# Patient Record
Sex: Male | Born: 1965 | Smoking: Former smoker
Health system: Southern US, Community
[De-identification: ages and names within clinical notes are randomized; demographics above are authoritative.]

## PROBLEM LIST (undated history)

## (undated) DIAGNOSIS — F419 Anxiety disorder, unspecified: Secondary | ICD-10-CM

## (undated) DIAGNOSIS — G500 Trigeminal neuralgia: Secondary | ICD-10-CM

## (undated) DIAGNOSIS — G9050 Complex regional pain syndrome I, unspecified: Secondary | ICD-10-CM

## (undated) DIAGNOSIS — K589 Irritable bowel syndrome without diarrhea: Secondary | ICD-10-CM

## (undated) HISTORY — DX: Anxiety disorder, unspecified: F41.9

## (undated) HISTORY — DX: Complex regional pain syndrome I, unspecified: G90.50

## (undated) HISTORY — DX: Trigeminal neuralgia: G50.0

## (undated) HISTORY — DX: Irritable bowel syndrome without diarrhea: K58.9

## (undated) HISTORY — PX: KNEE SURGERY: SHX244

## (undated) HISTORY — PX: REPLACEMENT TOTAL KNEE: SUR1224

## (undated) HISTORY — PX: INTRATHECAL PUMP IMPLANT: SHX6809

---

## 2019-08-19 ENCOUNTER — Telehealth: Payer: Self-pay

## 2019-08-21 ENCOUNTER — Other Ambulatory Visit: Payer: Self-pay

## 2019-08-21 ENCOUNTER — Ambulatory Visit
Admission: RE | Admit: 2019-08-21 | Discharge: 2019-08-21 | Disposition: A | Discharge status: Home / Self Care | Payer: Medicare Other | Source: Ambulatory Visit | Attending: Pain Medicine | Admitting: Pain Medicine

## 2019-08-21 ENCOUNTER — Ambulatory Visit (HOSPITAL_BASED_OUTPATIENT_CLINIC_OR_DEPARTMENT_OTHER): Payer: Medicare Other | Admitting: Pain Medicine

## 2019-08-21 ENCOUNTER — Encounter: Payer: Self-pay | Admitting: Pain Medicine

## 2019-08-21 VITALS — BP 118/92 | HR 97 | Temp 98.1°F | Resp 16 | Ht 72.0 in | Wt 206.0 lb

## 2019-08-21 DIAGNOSIS — G894 Chronic pain syndrome: Secondary | ICD-10-CM

## 2019-08-21 DIAGNOSIS — Z96651 Presence of right artificial knee joint: Secondary | ICD-10-CM | POA: Insufficient documentation

## 2019-08-21 DIAGNOSIS — G9050 Complex regional pain syndrome I, unspecified: Secondary | ICD-10-CM | POA: Insufficient documentation

## 2019-08-21 DIAGNOSIS — Z95828 Presence of other vascular implants and grafts: Secondary | ICD-10-CM

## 2019-08-21 DIAGNOSIS — M5441 Lumbago with sciatica, right side: Secondary | ICD-10-CM | POA: Insufficient documentation

## 2019-08-21 DIAGNOSIS — M25562 Pain in left knee: Secondary | ICD-10-CM | POA: Insufficient documentation

## 2019-08-21 DIAGNOSIS — M7918 Myalgia, other site: Secondary | ICD-10-CM

## 2019-08-21 DIAGNOSIS — M1712 Unilateral primary osteoarthritis, left knee: Secondary | ICD-10-CM | POA: Insufficient documentation

## 2019-08-21 DIAGNOSIS — M19012 Primary osteoarthritis, left shoulder: Secondary | ICD-10-CM

## 2019-08-21 DIAGNOSIS — Z789 Other specified health status: Secondary | ICD-10-CM | POA: Insufficient documentation

## 2019-08-21 DIAGNOSIS — K589 Irritable bowel syndrome without diarrhea: Secondary | ICD-10-CM

## 2019-08-21 DIAGNOSIS — M792 Neuralgia and neuritis, unspecified: Secondary | ICD-10-CM

## 2019-08-21 DIAGNOSIS — M25561 Pain in right knee: Secondary | ICD-10-CM

## 2019-08-21 DIAGNOSIS — M542 Cervicalgia: Secondary | ICD-10-CM | POA: Insufficient documentation

## 2019-08-21 DIAGNOSIS — M797 Fibromyalgia: Secondary | ICD-10-CM

## 2019-08-21 DIAGNOSIS — M79601 Pain in right arm: Secondary | ICD-10-CM

## 2019-08-21 DIAGNOSIS — M79605 Pain in left leg: Secondary | ICD-10-CM | POA: Diagnosis present

## 2019-08-21 DIAGNOSIS — R252 Cramp and spasm: Secondary | ICD-10-CM

## 2019-08-21 DIAGNOSIS — M51379 Other intervertebral disc degeneration, lumbosacral region without mention of lumbar back pain or lower extremity pain: Secondary | ICD-10-CM

## 2019-08-21 DIAGNOSIS — M25512 Pain in left shoulder: Secondary | ICD-10-CM

## 2019-08-21 DIAGNOSIS — G8929 Other chronic pain: Secondary | ICD-10-CM | POA: Insufficient documentation

## 2019-08-21 DIAGNOSIS — M25532 Pain in left wrist: Secondary | ICD-10-CM

## 2019-08-21 DIAGNOSIS — F411 Generalized anxiety disorder: Secondary | ICD-10-CM | POA: Insufficient documentation

## 2019-08-21 DIAGNOSIS — F329 Major depressive disorder, single episode, unspecified: Secondary | ICD-10-CM

## 2019-08-21 DIAGNOSIS — M5134 Other intervertebral disc degeneration, thoracic region: Secondary | ICD-10-CM

## 2019-08-21 DIAGNOSIS — M79602 Pain in left arm: Secondary | ICD-10-CM

## 2019-08-21 DIAGNOSIS — M25531 Pain in right wrist: Secondary | ICD-10-CM

## 2019-08-21 DIAGNOSIS — M899 Disorder of bone, unspecified: Secondary | ICD-10-CM

## 2019-08-21 DIAGNOSIS — H548 Legal blindness, as defined in USA: Secondary | ICD-10-CM

## 2019-08-21 DIAGNOSIS — Z451 Encounter for adjustment and management of infusion pump: Secondary | ICD-10-CM

## 2019-08-21 DIAGNOSIS — M25511 Pain in right shoulder: Secondary | ICD-10-CM | POA: Diagnosis not present

## 2019-08-21 DIAGNOSIS — M47816 Spondylosis without myelopathy or radiculopathy, lumbar region: Secondary | ICD-10-CM

## 2019-08-21 DIAGNOSIS — M5442 Lumbago with sciatica, left side: Secondary | ICD-10-CM | POA: Diagnosis not present

## 2019-08-21 DIAGNOSIS — M79643 Pain in unspecified hand: Secondary | ICD-10-CM

## 2019-08-21 DIAGNOSIS — R208 Other disturbances of skin sensation: Secondary | ICD-10-CM | POA: Insufficient documentation

## 2019-08-21 DIAGNOSIS — M79604 Pain in right leg: Secondary | ICD-10-CM | POA: Diagnosis present

## 2019-08-21 DIAGNOSIS — M5137 Other intervertebral disc degeneration, lumbosacral region: Secondary | ICD-10-CM

## 2019-08-21 DIAGNOSIS — M5030 Other cervical disc degeneration, unspecified cervical region: Secondary | ICD-10-CM

## 2019-08-21 DIAGNOSIS — Z79899 Other long term (current) drug therapy: Secondary | ICD-10-CM

## 2019-08-21 DIAGNOSIS — F32A Depression, unspecified: Secondary | ICD-10-CM

## 2019-08-21 DIAGNOSIS — G4701 Insomnia due to medical condition: Secondary | ICD-10-CM

## 2019-08-21 DIAGNOSIS — G500 Trigeminal neuralgia: Secondary | ICD-10-CM | POA: Insufficient documentation

## 2019-08-21 DIAGNOSIS — M549 Dorsalgia, unspecified: Secondary | ICD-10-CM

## 2019-08-21 DIAGNOSIS — M19011 Primary osteoarthritis, right shoulder: Secondary | ICD-10-CM

## 2019-08-21 NOTE — Progress Notes (Addendum)
Patient's Name: Bruce Ferguson  MRN: [id number]  Referring Provider: Delbert Harness, MD  DOB: [birth year]  PCP: Patient, No Pcp Per  DOS: 08/21/2019  Note by: Gaspar Cola, MD  Service setting: Ambulatory outpatient  Specialty: Interventional Pain Management  Location: [facility] (AMB) Pain Management Facility  Visit type: Initial Patient Evaluation  Patient type: New Patient   Primary Reason(s) for Visit: Encounter for initial evaluation of one or more chronic problems (new to examiner) potentially causing chronic pain, and posing a threat to normal musculoskeletal function. (Level of risk: High) CC: Facial Pain (trigeminal neuralgia), Neck Pain (numbness in both arms and hands), Shoulder Pain (bilateral), Back Pain (upper, middle, lower), Hip Pain (bilateral), and Abdominal Pain (crps due to IT pump insertion)  HPI  Mr. Bruce Ferguson is a 53 y.o. year old, male patient, who comes today to see [place] for the first time for an initial evaluation of his chronic pain. He has Chronic pain syndrome; Pharmacologic therapy; Disorder of skeletal system; Problems influencing health status; Fibromyalgia syndrome; Chronic knee pain (Fourth area of Pain) (Bilateral); Chronic knee pain s/p TKR (Right); Osteoarthritis of knee (Left); Chronic low back pain (Primary Area of Pain) (Bilateral) (R>L) w/ sciatica (Bilateral); Chronic lower extremity pain (Third area of Pain) (Bilateral) (L>R); Neurogenic pain; Chronic musculoskeletal pain; Chronic upper extremity pain (Fifth area of Pain) (Bilateral) (R>L); Trigeminal neuralgia syndrome (Left); Presence of implanted infusion pump (Fluonix); Encounter for interrogation of infusion pump; Legally blind; Chronic shoulder pain (Secondary area of Pain) (Bilateral) (L>R); Cervicalgia; Chronic neck pain (Sixth area of Pain) (Bilateral) (L>R); Chronic wrist pain (Bilateral) (R>L); Chronic hand pain (Bilateral); IBS (irritable bowel syndrome); CRPS (complex regional pain syndrome type I); Chronic upper  back pain (Bilateral); Insomnia secondary to chronic pain; Generalized anxiety disorder; Allodynia; Hyperalgesia; Muscle cramps; and Chronic depression on their problem list. Today he comes in for evaluation of his Facial Pain (trigeminal neuralgia), Neck Pain (numbness in both arms and hands), Shoulder Pain (bilateral), Back Pain (upper, middle, lower), Hip Pain (bilateral), and Abdominal Pain (crps due to IT pump insertion)  Pain Assessment: Location:   Back Radiating:  To both lower extremities Onset: More than a month ago Duration: Chronic pain Quality: Burning, Stabbing Severity: 8 /10 (subjective, self-reported pain score)  Note: Reported level is inconsistent with clinical observations. Clinically the patient looks like a 4/10 A 4/10 is viewed as "Moderately Severe" and described as impossible to ignore for more than a few minutes. With effort, patients may still be able to manage work or participate in some social activities. Very difficult to concentrate. Signs of autonomic nervous system discharge are evident: dilated pupils (mydriasis); mild sweating (diaphoresis); sleep interference. Heart rate becomes elevated (>115 bpm). Diastolic blood pressure (lower number) rises above 100 mmHg. Patients find relief in laying down and not moving. Information on the proper use of the pain scale provided to the patient today. When using our objective Pain Scale, levels between 6 and 10/10 are said to belong in an emergency room, as it progressively worsens from a 6/10, described as severely limiting, requiring emergency care not usually available at an outpatient pain management facility. At a 6/10 level, communication becomes difficult and requires great effort. Assistance to reach the emergency department may be required. Facial flushing and profuse sweating along with potentially dangerous increases in heart rate and blood pressure will be evident. Effect on ADL: difficulty performing daily  activities Timing: Constant Modifying factors: Oxycodone, hot bath, Epsom Salt, biofeedback BP: (!) 118/92  HR: 97  Onset and Duration: Gradual Cause of pain: CRPS Severity: Getting worse, NAS-11 at its worse: 10/10, NAS-11 at its best: 8/10, NAS-11 now: 9/10 and NAS-11 on the average: 8/10 Timing: Night, Not influenced by the time of the day and After activity or exercise Aggravating Factors: Bending, Bowel movements, Climbing, Kneeling, Lifiting, Motion, Prolonged sitting, Prolonged standing, Squatting, Stooping , Surgery made it worse, Twisting, Walking, Walking uphill, Walking downhill and Working Alleviating Factors: Biofeedback, Hot packs, Medications and Warm showers or baths Associated Problems: Color changes, Constipation, Day-time cramps, Night-time cramps, Depression, Dizziness, Fatigue, Inability to control bladder (urine), Nausea, Numbness, Personality changes, Sadness, Spasms, Swelling, Temperature changes, Tingling, Vomiting , Weakness, Pain that wakes patient up and Pain that does not allow patient to sleep Quality of Pain: Aching, Agonizing, Annoying, Burning, Constant, Cramping, Cruel, Deep, Disabling, Distressing, Dreadful, Exhausting, Fearful, Feeling of constriction, Feeling of weight, Heavy, Horrible, Hot, Nagging, Pressure-like, Pulsating, Punishing, Sharp, Shooting, Sickening, Stabbing, Tender, Throbbing, Tingling, Tiring, Toothache-like and Uncomfortable Previous Examinations or Tests: Endoscopy, MRI scan, X-rays, Neurological evaluation, Orthopedic evaluation and Chiropractic evaluation Previous Treatments: Biofeedback, Morphine pump, Narcotic medications and Steroid treatments by mouth  The patient comes into the clinics today for the first time for a chronic pain management evaluation.  The patient is being referred to [place] from [place] state for management of an intrathecal pump.  According to the patient the primary area of pain is that of the lower back, bilaterally,  with the right side being worse than the left.  He denies any surgeries, except for the intrathecal pump placement around Apr 22, 2019.  The patient denies any physical therapy but does admit to a more recent MRI of the lumbar area.  The patient secondary area pain is that of the shoulders, bilaterally, with the left being worse than the right.  Again he denies any type of surgery of the shoulders but he does admit to having had physical therapy for the shoulders many years ago.  He denies any recent x-rays or any other type of imaging.  The next area pain is described to be that of the lower extremities, bilaterally, with the left being worse than the right.  In the case of the left lower extremity the pain goes all the way down to the ankle running through the back of the leg with pain also in the groin area.  In the case of the right lower extremity the pain goes all the way down into the toes following what seems to be an L4/L5 dermatomal distribution.  Included within this, the patient has bilateral knee pain with the left having had 7 different arthroscopies and on the right side having had a total knee replacement around 2020.  He does admit having had bilateral knee MRIs with the right one having been approximately 3 years ago and the left one in 2010.  He also does admit to having had some x-rays of the area, but none of this seems to be available for this evaluation.  This knee pain is described to be the patient's fourth area of pain.  The patient's fifth area of pain is that of the wrists, hands, and forearms, bilaterally with the right being worse than the left.  He again denies any type of surgeries in the area but does admit having had some x-rays on the right side after a fall.  He denies any recent physical therapy for that area and pain.  The next area pain is described to be that of  the necks and shoulders where the pain is referred from the neck to the shoulders and shoulder blades through the  back of the neck, bilaterally, with the left being worse than the right.  This pain seems to radiate towards the shoulder blades and the biceps area following what seems to be a C5 dermatomal distribution.  In terms of the pain in the biceps area is seems to be worse on the left side when compared to the right.  Once again, he denies any surgeries of the cervical region, physical therapy, but does admit having had some x-rays, some time ago.  The patient describes his next area of pain is been that of the irritable bowel syndrome, which he believes to be associated with his fibromyalgia  The next area pain is that of a trigeminal neuralgia on the left side which he has had problems with for the past 3 years.  See also has a tentative diagnosis of CRPS type I, but he indicates that this is "generalized".  He has been told that he has this CRPS that is affecting his upper and lower extremities, which is extremely rare.  I asked the patient whether or not he has had any nerve conduction test and he indicated that he had some many years ago when he refuses to have any more due to how bad it was to him.  He refers that he reacts really bad to any kind of electrical stimulation such as the spinal cord stimulator or the TENS unit.  In both instances he refers that it made the pain worse.  Today I took the time to provide the patient with information regarding my pain practice. The patient was informed that my practice is divided into two sections: an interventional pain management section, as well as a completely separate and distinct medication management section. I explained that I have procedure days for my interventional therapies, and evaluation days for follow-ups and medication management. Because of the amount of documentation required during both, they are kept separated. This means that there is the possibility that he may be scheduled for a procedure on one day, and medication management the next. I have  also informed him that because of staffing and facility limitations, I no longer take patients for medication management only. To illustrate the reasons for this, I gave the patient the example of surgeons, and how inappropriate it would be to refer a patient to his/her care, just to write for the post-surgical antibiotics on a surgery done by a different surgeon.   Because interventional pain management is my board-certified specialty, the patient was informed that joining my practice means that they are open to any and all interventional therapies. I made it clear that this does not mean that they will be forced to have any procedures done. What this means is that I believe interventional therapies to be essential part of the diagnosis and proper management of chronic pain conditions. Therefore, patients not interested in these interventional alternatives will be better served under the care of a different practitioner.  The patient was also made aware of my Comprehensive Pain Management Safety Guidelines where by joining my practice, they limit all of their nerve blocks and joint injections to those done by our practice, for as long as we are retained to manage their care.   Historic Controlled Substance Pharmacotherapy Review  PMP and historical list of controlled substances: Carisoprodol (soma) 350 mg; oxycodone/APAP 10/325; diazepam 5 mg; zolpidem 10 mg; hydromorphone 2  mg; Current opioid analgesics: Oxycodone/APAP 10/325, 1 tab PO q 6 hrs (40 mg/day of oxycodone) MME/day: 60 mg/day.  Medications: The patient did not bring the medication(s) to the appointment, as requested in our "New Patient Package" Pharmacodynamics: Desired effects: Analgesia: The patient reports >50% benefit. Reported improvement in function: The patient reports medication allows him to accomplish basic ADLs. Clinically meaningful improvement in function (CMIF): Sustained CMIF goals met Perceived effectiveness: Described as  relatively effective, allowing for increase in activities of daily living (ADL) Undesirable effects: Side-effects or Adverse reactions: None reported Historical Monitoring: The patient  reports no history of drug use. List of all UDS Test(s): No results found. List of other Serum/Urine Drug Screening Test(s):  No results found. Historical Background Evaluation: [place] PMP: PDMP reviewed during this encounter. Six (6) year initial data search conducted.             PMP NARX Score Report:  Narcotic: 371 Sedative: 323 Stimulant: 000 [place] Department of public safety, offender search: Editor, commissioning Information) Non-contributory Risk Assessment Profile: Aberrant behavior: None observed or detected today Risk factors for fatal opioid overdose: None identified today PMP NARX Overdose Risk Score: 350 Fatal overdose hazard ratio (HR): Calculation deferred Non-fatal overdose hazard ratio (HR): Calculation deferred Risk of opioid abuse or dependence: 0.7-3.0% with doses ? 36 MME/day and 6.1-26% with doses ? 120 MME/day. Substance use disorder (SUD) risk level: See below Personal History of Substance Abuse (SUD-Substance use disorder):  Alcohol: Negative  Illegal Drugs: Negative  Rx Drugs: Negative  ORT Risk Level calculation: Low Risk Opioid Risk Tool - 08/21/19 1027      Family History of Substance Abuse   Alcohol  Negative    Illegal Drugs  Negative    Rx Drugs  Negative      Personal History of Substance Abuse   Alcohol  Negative    Illegal Drugs  Negative    Rx Drugs  Negative      Age   Age between 59-45 years   No      History of Preadolescent Sexual Abuse   History of Preadolescent Sexual Abuse  Negative or Male      Psychological Disease   Psychological Disease  Negative    Depression  Negative      Total Score   Opioid Risk Tool Scoring  0    Opioid Risk Interpretation  Low Risk      ORT Scoring interpretation table:  Score <3 = Low Risk for SUD  Score between 4-7 =  Moderate Risk for SUD  Score >8 = High Risk for Opioid Abuse   PHQ-2 Depression Scale:  Total score: 0  PHQ-2 Scoring interpretation table: (Score and probability of major depressive disorder)  Score 0 = No depression  Score 1 = 15.4% Probability  Score 2 = 21.1% Probability  Score 3 = 38.4% Probability  Score 4 = 45.5% Probability  Score 5 = 56.4% Probability  Score 6 = 78.6% Probability   PHQ-9 Depression Scale:  Total score: 0  PHQ-9 Scoring interpretation table:  Score 0-4 = No depression  Score 5-9 = Mild depression  Score 10-14 = Moderate depression  Score 15-19 = Moderately severe depression  Score 20-27 = Severe depression (2.4 times higher risk of SUD and 2.89 times higher risk of overuse)   Pharmacologic Plan: As per protocol, I have not taken over any controlled substance management, pending the results of ordered tests and/or consults.  Initial impression: Pending review of available data and ordered tests.  Meds   Current Outpatient Medications:  .  buPROPion (WELLBUTRIN XL) 300 MG 24 hr tablet, Take 300 mg by mouth daily., Disp: , Rfl:  .  carbamazepine (TEGRETOL XR) 200 MG 12 hr tablet, Take 200 mg by mouth daily., Disp: , Rfl:  .  carisoprodol (SOMA) 350 MG tablet, Take 350 mg by mouth 4 (four) times daily., Disp: , Rfl:  .  diazepam (VALIUM) 5 MG tablet, Take 5 mg by mouth daily as needed for anxiety., Disp: , Rfl:  .  Ferrous Sulfate (IRON SLOW RELEASE) 142 (45 Fe) MG TBCR, Take by mouth daily., Disp: , Rfl:  .  naloxone (NARCAN) nasal spray 4 mg/0.1 mL, Place 1 spray into the nose as needed., Disp: , Rfl:  .  oxyCODONE-acetaminophen (PERCOCET) 10-325 MG tablet, Take 1 tablet by mouth every 6 (six) hours as needed for pain., Disp: , Rfl:  .  PAIN MANAGEMENT INTRATHECAL, IT, PUMP, 1 each by Intrathecal route. Intrathecal (IT) medication:  Morphine, Disp: , Rfl:  .  sitaGLIPtin (JANUVIA) 100 MG tablet, Take 100 mg by mouth daily., Disp: , Rfl:  .   traZODone (DESYREL) 150 MG tablet, Take by mouth at bedtime., Disp: , Rfl:  .  zolpidem (AMBIEN) 10 MG tablet, Take 10 mg by mouth at bedtime as needed for sleep., Disp: , Rfl:   Imaging Review         Complexity Note: Intrathecal catheter placement seems to be in the cervical region with the tip located on the left side at C6, in the anterior canal.                        ROS  Cardiovascular: No reported cardiovascular signs or symptoms such as High blood pressure, coronary artery disease, abnormal heart rate or rhythm, heart attack, blood thinner therapy or heart weakness and/or failure Pulmonary or Respiratory: Snoring  Neurological: Abnormal skin sensations (Peripheral Neuropathy) and Incontinence:  Urinary Review of Past Neurological Studies: No results found for this or any previous visit. Psychological-Psychiatric: Anxiousness, Depressed, Prone to panicking and Difficulty sleeping and or falling asleep Gastrointestinal: Vomiting blood (Ulcers), Heartburn due to stomach pushing into lungs (Hiatal hernia), Alternating episodes iof diarrhea and constipation (IBS-Irritable bowe syndrome) and Irregular, infrequent bowel movements (Constipation) Genitourinary: Passing kidney stones Hematological: Weakness due to low blood hemoglobin or red blood cell count (Anemia) Endocrine: No reported endocrine signs or symptoms such as high or low blood sugar, rapid heart rate due to high thyroid levels, obesity or weight gain due to slow thyroid or thyroid disease Rheumatologic: Joint aches and or swelling due to excess weight (Osteoarthritis), Generalized muscle aches (Fibromyalgia) and Constant unexplained fatigue (Chronic Fatigue Syndrome) Musculoskeletal: Negative for myasthenia gravis, muscular dystrophy, multiple sclerosis or malignant hyperthermia Work History: Disabled  Allergies  Mr. Kropf is allergic to amitriptyline; penicillins; and strawberry extract.  Laboratory Chemistry Profile    Screening No results found.  Inflammation (CRP: Acute Phase) (ESR: Chronic Phase) Lab Results  Component Value Date   CRP 3 08/21/2019   ESRSEDRATE 11 08/21/2019                         Rheumatology No results found.  Renal Lab Results  Component Value Date   BUN 13 08/21/2019   CREATININE 0.75 (L) 08/21/2019   BCR 17 08/21/2019   GFRAA 122 08/21/2019   GFRNONAA 105 08/21/2019  Hepatic Lab Results  Component Value Date   AST 16 08/21/2019   ALBUMIN 4.6 08/21/2019   ALKPHOS 41 08/21/2019                        Electrolytes Lab Results  Component Value Date   NA 135 08/21/2019   K 5.1 08/21/2019   CL 101 08/21/2019   CALCIUM 9.4 08/21/2019   MG 1.9 08/21/2019                        Neuropathy Lab Results  Component Value Date   [id number] 885 08/21/2019                        CNS No results found.  Bone Lab Results  Component Value Date   25OHVITD1 WILL FOLLOW 08/21/2019   25OHVITD2 WILL FOLLOW 08/21/2019   25OHVITD3 WILL FOLLOW 08/21/2019                         Coagulation No results found.  Cardiovascular No results found.  ID No results found.  Cancer No results found.  Endocrine No results found.  Note: Lab results reviewed.  [place]  Drug: Mr. Mcconathy  reports no history of drug use. Alcohol:  reports no history of alcohol use. Tobacco:  reports that he has quit smoking. He has never used smokeless tobacco. Medical:  has a past medical history of Anxiety, CRPS (complex regional pain syndrome type I), Irritable bowel, and Trigeminal neuralgia. Family: family history is not on file.  Active Ambulatory Problems    Diagnosis Date Noted  . Chronic pain syndrome 08/21/2019  . Pharmacologic therapy 08/21/2019  . Disorder of skeletal system 08/21/2019  . Problems influencing health status 08/21/2019  . Fibromyalgia syndrome 08/21/2019  . Chronic knee pain (Fourth area of Pain) (Bilateral) 08/21/2019  .  Chronic knee pain s/p TKR (Right) 08/21/2019  . Osteoarthritis of knee (Left) 08/21/2019  . Chronic low back pain (Primary Area of Pain) (Bilateral) (R>L) w/ sciatica (Bilateral) 08/21/2019  . Chronic lower extremity pain (Third area of Pain) (Bilateral) (L>R) 08/21/2019  . Neurogenic pain 08/21/2019  . Chronic musculoskeletal pain 08/21/2019  . Chronic upper extremity pain (Fifth area of Pain) (Bilateral) (R>L) 08/21/2019  . Trigeminal neuralgia syndrome (Left) 08/21/2019  . Presence of implanted infusion pump (Fluonix) 08/21/2019  . Encounter for interrogation of infusion pump 08/21/2019  . Legally blind 08/21/2019  . Chronic shoulder pain (Secondary area of Pain) (Bilateral) (L>R) 08/21/2019  . Cervicalgia 08/21/2019  . Chronic neck pain (Sixth area of Pain) (Bilateral) (L>R) 08/21/2019  . Chronic wrist pain (Bilateral) (R>L) 08/21/2019  . Chronic hand pain (Bilateral) 08/21/2019  . IBS (irritable bowel syndrome) 08/21/2019  . CRPS (complex regional pain syndrome type I) 08/21/2019  . Chronic upper back pain (Bilateral) 08/21/2019  . Insomnia secondary to chronic pain 08/21/2019  . Generalized anxiety disorder 08/21/2019  . Allodynia 08/21/2019  . Hyperalgesia 08/21/2019  . Muscle cramps 08/21/2019  . Chronic depression 08/21/2019   Resolved Ambulatory Problems    Diagnosis Date Noted  . No Resolved Ambulatory Problems   Past Medical History:  Diagnosis Date  . Anxiety   . Irritable bowel   . Trigeminal neuralgia    Constitutional Exam  General appearance: Well nourished, well developed, and well hydrated. In no apparent acute distress Vitals:   08/21/19 1010  BP: (!) 118/92  Pulse: 97  Resp: 16  Temp: 98.1 F (36.7 C)  TempSrc: Oral  SpO2: 98%  Weight: 206 lb (93.4 kg)  Height: 6' (1.829 m)   BMI Assessment: Estimated body mass index is 27.94 kg/m as calculated from the following:   Height as of this encounter: 6' (1.829 m).   Weight as of this encounter: 206  lb (93.4 kg).  BMI interpretation table: BMI level Category Range association with higher incidence of chronic pain  <18 kg/m2 Underweight   18.5-24.9 kg/m2 Ideal body weight   25-29.9 kg/m2 Overweight Increased incidence by 20%  30-34.9 kg/m2 Obese (Class I) Increased incidence by 68%  35-39.9 kg/m2 Severe obesity (Class II) Increased incidence by 136%  >40 kg/m2 Extreme obesity (Class III) Increased incidence by 254%   Patient's current BMI Ideal Body weight  Body mass index is 27.94 kg/m. Ideal body weight: 77.6 kg (171 lb 1.2 oz) Adjusted ideal body weight: 83.9 kg (185 lb 0.7 oz)   BMI Readings from Last 4 Encounters:  08/21/19 27.94 kg/m   Wt Readings from Last 4 Encounters:  08/21/19 206 lb (93.4 kg)  Psych/Mental status: Alert, oriented x 3 (person, place, & time)       Eyes: Legally blind Respiratory: No evidence of acute respiratory distress  Gait & Posture Assessment  Ambulation: Patient ambulates using a cane Gait: Limited. Using assistive device to ambulate Posture: Antalgic   Assessment  Primary Diagnosis & Pertinent Problem List: The primary encounter diagnosis was Chronic pain syndrome. Diagnoses of Presence of implanted infusion pump (Fluonix), Chronic low back pain (Bilateral) w/ sciatica (Bilateral), Chronic shoulder pain (Secondary area of Pain) (Bilateral) (L>R), Chronic lower extremity pain (Bilateral), Chronic knee pain (Bilateral), Chronic knee pain s/p TKR (Right), Osteoarthritis of knee (Left), Chronic upper extremity pain (Bilateral), Chronic wrist pain (Bilateral) (R>L), Chronic hand pain (Bilateral) (R>L), Chronic neck pain (Bilateral) (L>R), Cervicalgia, Chronic upper back pain (Bilateral), Trigeminal neuralgia syndrome, Neurogenic pain, Chronic musculoskeletal pain, Fibromyalgia syndrome, Complex regional pain syndrome type 1, affecting unspecified site, Pharmacologic therapy, Disorder of skeletal system, Problems influencing health status, Irritable  bowel syndrome, unspecified type, Legally blind, Encounter for interrogation of infusion pump, Insomnia secondary to chronic pain, Generalized anxiety disorder, Allodynia, Hyperalgesia, Muscle cramps, and Chronic depression were also pertinent to this visit.  Visit Diagnosis (New problems to examiner): 1. Chronic pain syndrome   2. Presence of implanted infusion pump (Fluonix)   3. Chronic low back pain (Bilateral) w/ sciatica (Bilateral)   4. Chronic shoulder pain (Secondary area of Pain) (Bilateral) (L>R)   5. Chronic lower extremity pain (Bilateral)   6. Chronic knee pain (Bilateral)   7. Chronic knee pain s/p TKR (Right)   8. Osteoarthritis of knee (Left)   9. Chronic upper extremity pain (Bilateral)   10. Chronic wrist pain (Bilateral) (R>L)   11. Chronic hand pain (Bilateral) (R>L)   12. Chronic neck pain (Bilateral) (L>R)   13. Cervicalgia   14. Chronic upper back pain (Bilateral)   15. Trigeminal neuralgia syndrome   16. Neurogenic pain   17. Chronic musculoskeletal pain   18. Fibromyalgia syndrome   19. Complex regional pain syndrome type 1, affecting unspecified site   20. Pharmacologic therapy   21. Disorder of skeletal system   22. Problems influencing health status   23. Irritable bowel syndrome, unspecified type   24. Legally blind   25. 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 Generalized anxiety disorder  28. Allodynia   29. Hyperalgesia   30. Muscle cramps   31. Chronic depression    Plan of Care (Initial workup plan)  Note: Mr. Helget was reminded that as per protocol, today's visit has been an evaluation only. We have not taken over the patient's controlled substance management.  Problem-specific plan: No problem-specific Assessment & Plan notes found for this encounter.   Lab Orders     Compliance Drug Analysis, Ur     Comp. Metabolic Panel (12)     Magnesium     Vitamin B12     Sedimentation rate      25-Hydroxyvitamin D Lcms D2+D3     C-reactive protein  Imaging Orders     DG Lumbar Spine Complete W/Bend     DG Knee 1-2 Views Right     DG Knee 1-2 Views Left     DG Thoracic Spine 2 View     DG Cervical Spine With Flex & Extend     DG Shoulder Right     DG Shoulder Left  Referral Orders     Ambulatory referral to Psychiatry Procedure Orders    No procedure(s) ordered today   Pharmacotherapy (current): Medications ordered:  No orders of the defined types were placed in this encounter.  Medications administered during this visit: Randy Castrejon had no medications administered during this visit.   Pharmacological management options:  Opioid Analgesics: The patient was informed that there is no guarantee that he would be a candidate for opioid analgesics. The decision will be made following CDC guidelines. This decision will be based on the results of diagnostic studies, as well as Mr. Shere risk profile.   Membrane stabilizer: To be determined at a later time  Muscle relaxant: To be determined at a later time  NSAID: To be determined at a later time  Other analgesic(s): To be determined at a later time   Interventional management options: Mr. Elena was informed that there is no guarantee that he would be a candidate for interventional therapies. The decision will be based on the results of diagnostic studies, as well as Mr. Petite risk profile.  Procedure(s) under consideration:  Management of implantable intrathecal pump including refills.   Provider-requested follow-up: Return for (VV), 2nd visit for (MM), pending when oral medications are due.  Evaluation of x-rays and lab work..  Future Appointments  Date Time Provider [place]  09/09/2019  9:00 AM Milinda Pointer, MD [facility] None  10/08/2019 11:15 AM Milinda Pointer, MD [facility] None    Primary Care Physician: Patient, No Pcp Per Location: [facility] Note by: Gaspar Cola, MD Date: 08/21/2019; Time: 12:15 PM  Note: This dictation was prepared with Dragon dictation. Any transcriptional errors that may result from this process are unintentional.

## 2019-08-21 NOTE — Progress Notes (Signed)
Safety precautions to be maintained throughout the outpatient stay will include: orient to surroundings, keep bed in low position, maintain call bell within reach at all times, provide assistance with transfer out of bed and ambulation.  

## 2019-08-21 NOTE — Patient Instructions (Signed)
____________________________________________________________________________________________  Drug Holidays (Slow)  What is a "Drug Holiday"? Drug Holiday: is the name given to the period of time during which a patient stops taking a medication(s) for the purpose of eliminating tolerance to the drug.  Benefits . Improved effectiveness of opioids. . Decreased opioid dose needed to achieve benefits. . Improved pain with lesser dose.  What is tolerance? Tolerance: is the progressive decreased in effectiveness of a drug due to its repetitive use. With repetitive use, the body gets use to the medication and as a consequence, it loses its effectiveness. This is a common problem seen with opioid pain medications. As a result, a larger dose of the drug is needed to achieve the same effect that used to be obtained with a smaller dose.  How long should a "Drug Holiday" last? You should stay off of the pain medicine for at least 14 consecutive days. (2 weeks)  Should I stop the medicine "cold turkey"? No. You should always coordinate with your Pain Specialist so that he/she can provide you with the correct medication dose to make the transition as smoothly as possible.  How do I stop the medicine? Slowly. You will be instructed to decrease the daily amount of pills that you take by one (1) pill every seven (7) days. This is called a "slow downward taper" of your dose. For example: if you normally take four (4) pills per day, you will be asked to drop this dose to three (3) pills per day for seven (7) days, then to two (2) pills per day for seven (7) days, then to one (1) per day for seven (7) days, and at the end of those last seven (7) days, this is when the "Drug Holiday" would start.   Will I have withdrawals? By doing a "slow downward taper" like this one, it is unlikely that you will experience any significant withdrawal symptoms. Typically, what triggers withdrawals is the sudden stop of a high  dose opioid therapy. Withdrawals can usually be avoided by slowly decreasing the dose over a prolonged period of time.  What are withdrawals? Withdrawals: refers to the wide range of symptoms that occur after stopping or dramatically reducing opiate drugs after heavy and prolonged use. Withdrawal symptoms do not occur to patients that use low dose opioids, or those who take the medication sporadically. Contrary to benzodiazepine (example: Valium, Xanax, etc.) or alcohol withdrawals ("Delirium Tremens"), opioid withdrawals are not lethal. Withdrawals are the physical manifestation of the body getting rid of the excess receptors.  Expected Symptoms Early symptoms of withdrawal may include: . Agitation . Anxiety . Muscle aches . Increased tearing . Insomnia . Runny nose . Sweating . Yawning  Late symptoms of withdrawal may include: . Abdominal cramping . Diarrhea . Dilated pupils . Goose bumps . Nausea . Vomiting  Will I experience withdrawals? Due to the slow nature of the taper, it is very unlikely that you will experience any.  What is a slow taper? Taper: refers to the gradual decrease in dose.  ___________________________________________________________________________________________    ____________________________________________________________________________________________  Medication Rules  Purpose: To inform patients, and their family members, of our rules and regulations.  Applies to: All patients receiving prescriptions (written or electronic).  Pharmacy of record: Pharmacy where electronic prescriptions will be sent. If written prescriptions are taken to a different pharmacy, please inform the nursing staff. The pharmacy listed in the electronic medical record should be the one where you would like electronic prescriptions to be sent.  Electronic   prescriptions: In compliance with the [place] (STOP) Act of 2017 (Session  Lanny Cramp [phone]), effective December 05, 2018, all controlled substances must be electronically prescribed. Calling prescriptions to the pharmacy will cease to exist.  Prescription refills: Only during scheduled appointments. Applies to all prescriptions.  NOTE: The following applies primarily to controlled substances (Opioid* Pain Medications).   Patient's responsibilities: 1. Pain Pills: Bring all pain pills to every appointment (except for procedure appointments). 2. Pill Bottles: Bring pills in original pharmacy bottle. Always bring the newest bottle. Bring bottle, even if empty. 3. Medication refills: You are responsible for knowing and keeping track of what medications you take and those you need refilled. The day before your appointment: write a list of all prescriptions that need to be refilled. The day of the appointment: give the list to the admitting nurse. Prescriptions will be written only during appointments. No prescriptions will be written on procedure days. If you forget a medication: it will not be "Called in", "Faxed", or "electronically sent". You will need to get another appointment to get these prescribed. No early refills. Do not call asking to have your prescription filled early. 4. Prescription Accuracy: You are responsible for carefully inspecting your prescriptions before leaving our office. Have the discharge nurse carefully go over each prescription with you, before taking them home. Make sure that your name is accurately spelled, that your address is correct. Check the name and dose of your medication to make sure it is accurate. Check the number of pills, and the written instructions to make sure they are clear and accurate. Make sure that you are given enough medication to last until your next medication refill appointment. 5. Taking Medication: Take medication as prescribed. When it comes to controlled substances, taking less pills or less frequently than prescribed is  permitted and encouraged. Never take more pills than instructed. Never take medication more frequently than prescribed.  6. Inform other Doctors: Always inform, all of your healthcare providers, of all the medications you take. 7. Pain Medication from other Providers: You are not allowed to accept any additional pain medication from any other Doctor or Healthcare provider. There are two exceptions to this rule. (see below) In the event that you require additional pain medication, you are responsible for notifying us, as stated below. 8. Medication Agreement: You are responsible for carefully reading and following our Medication Agreement. This must be signed before receiving any prescriptions from our practice. Safely store a copy of your signed Agreement. Violations to the Agreement will result in no further prescriptions. (Additional copies of our Medication Agreement are available upon request.) 9. Laws, Rules, & Regulations: All patients are expected to follow all Federal and [employer], [employer], Rules, [employer]. Ignorance of the Laws does not constitute a valid excuse. The use of any illegal substances is prohibited. 10. Adopted CDC guidelines & recommendations: Target dosing levels will be at or below 60 MME/day. Use of benzodiazepines** is not recommended.  Exceptions: There are only two exceptions to the rule of not receiving pain medications from other Healthcare Providers. 1. Exception #1 (Emergencies): In the event of an emergency (i.e.: accident requiring emergency care), you are allowed to receive additional pain medication. However, you are responsible for: As soon as you are able, call our office [phone], at any time of the day or night, and leave a message stating your name, the date and nature of the emergency, and the name and dose of the medication  prescribed. In the event that your call is answered by a member of our staff, make sure to document and save the date, time, and  the name of the person that took your information.  2. Exception #2 (Planned Surgery): In the event that you are scheduled by another doctor or dentist to have any type of surgery or procedure, you are allowed (for a period no longer than 30 days), to receive additional pain medication, for the acute post-op pain. However, in this case, you are responsible for picking up a copy of our "Post-op Pain Management for Surgeons" handout, and giving it to your surgeon or dentist. This document is available at our office, and does not require an appointment to obtain it. Simply go to our office during business hours (Monday-Thursday from 8:00 AM to 4:00 PM) (Friday 8:00 AM to 12:00 Noon) or if you have a scheduled appointment with us, prior to your surgery, and ask for it by name. In addition, you will need to provide us with your name, name of your surgeon, type of surgery, and date of procedure or surgery.  *Opioid medications include: morphine, codeine, oxycodone, oxymorphone, hydrocodone, hydromorphone, meperidine, tramadol, tapentadol, buprenorphine, fentanyl, methadone. **Benzodiazepine medications include: diazepam (Valium), alprazolam (Xanax), clonazepam (Klonopine), lorazepam (Ativan), clorazepate (Tranxene), chlordiazepoxide (Librium), estazolam (Prosom), oxazepam (Serax), temazepam (Restoril), triazolam (Halcion) (Last updated: 02/01/2018) ____________________________________________________________________________________________   ____________________________________________________________________________________________  Medication Recommendations and Reminders  Applies to: All patients receiving prescriptions (written and/or electronic).  Medication Rules & Regulations: These rules and regulations exist for your safety and that of others. They are not flexible and neither are we. Dismissing or ignoring them will be considered "non-compliance" with medication therapy, resulting in complete and  irreversible termination of such therapy. (See document titled "Medication Rules" for more details.) In all conscience, because of safety reasons, we cannot continue providing a therapy where the patient does not follow instructions.  Pharmacy of record:   Definition: This is the pharmacy where your electronic prescriptions will be sent.   We do not endorse any particular pharmacy.  You are not restricted in your choice of pharmacy.  The pharmacy listed in the electronic medical record should be the one where you want electronic prescriptions to be sent.  If you choose to change pharmacy, simply notify our nursing staff of your choice of new pharmacy.  Recommendations:  Keep all of your pain medications in a safe place, under lock and key, even if you live alone.   After you fill your prescription, take 1 week's worth of pills and put them away in a safe place. You should keep a separate, properly labeled bottle for this purpose. The remainder should be kept in the original bottle. Use this as your primary supply, until it runs out. Once it's gone, then you know that you have 1 week's worth of medicine, and it is time to come in for a prescription refill. If you do this correctly, it is unlikely that you will ever run out of medicine.  To make sure that the above recommendation works, it is very important that you make sure your medication refill appointments are scheduled at least 1 week before you run out of medicine. To do this in an effective manner, make sure that you do not leave the office without scheduling your next medication management appointment. Always ask the nursing staff to show you in your prescription , when your medication will be running out. Then arrange for the receptionist to get you a return   appointment, at least 7 days before you run out of medicine. Do not wait until you have 1 or 2 pills left, to come in. This is very poor planning and does not take into consideration  that we may need to cancel appointments due to bad weather, sickness, or emergencies affecting our staff.  "Partial Fill": If for any reason your pharmacy does not have enough pills/tablets to completely fill or refill your prescription, do not allow for a "partial fill". You will need a separate prescription to fill the remaining amount, which we will not provide. If the reason for the partial fill is your insurance, you will need to talk to the pharmacist about payment alternatives for the remaining tablets, but again, do not accept a partial fill.  Prescription refills and/or changes in medication(s):   Prescription refills, and/or changes in dose or medication, will be conducted only during scheduled medication management appointments. (Applies to both, written and electronic prescriptions.)  No refills on procedure days. No medication will be changed or started on procedure days. No changes, adjustments, and/or refills will be conducted on a procedure day. Doing so will interfere with the diagnostic portion of the procedure.  No phone refills. No medications will be "called into the pharmacy".  No Fax refills.  No weekend refills.  No Holliday refills.  No after hours refills.  Remember:  Business hours are:  Monday to Thursday 8:00 AM to 4:00 PM Provider's Schedule: Milinda Pointer, MD - Appointments are:  Medication management: Monday and Wednesday 8:00 AM to 4:00 PM Procedure day: Tuesday and Thursday 7:30 AM to 4:00 PM Gillis Santa, MD - Appointments are:  Medication management: Tuesday and Thursday 8:00 AM to 4:00 PM Procedure day: Monday and Wednesday 7:30 AM to 4:00 PM (Last update: 02/01/2018) ____________________________________________________________________________________________   ____________________________________________________________________________________________  CANNABIDIOL (AKA: CBD Oil or Pills)  Applies to: All patients receiving prescriptions  of controlled substances (written and/or electronic).  General Information: Cannabidiol (CBD) was discovered in 75. It is one of some 113 identified cannabinoids in cannabis (Marijuana) plants, accounting for up to 40% of the plant's extract. As of 2018, preliminary clinical research on cannabidiol included studies of anxiety, cognition, movement disorders, and pain.  Cannabidiol is consummed in multiple ways, including inhalation of cannabis smoke or vapor, as an aerosol spray into the cheek, and by mouth. It may be supplied as CBD oil containing CBD as the active ingredient (no added tetrahydrocannabinol (THC) or terpenes), a full-plant CBD-dominant hemp extract oil, capsules, dried cannabis, or as a liquid solution. CBD is thought not have the same psychoactivity as THC, and may affect the actions of THC. Studies suggest that CBD may interact with different biological targets, including cannabinoid receptors and other neurotransmitter receptors. As of 2018 the mechanism of action for its biological effects has not been determined.  In the [place], cannabidiol has a limited approval by the Food and Drug Administration (FDA) for treatment of only two types of epilepsy disorders. The side effects of long-term use of the drug include somnolence, decreased appetite, diarrhea, fatigue, malaise, weakness, sleeping problems, and others.  CBD remains a Schedule I drug prohibited for any use.  Legality: Some manufacturers ship CBD products nationally, an illegal action which the FDA has not enforced in 2018, with CBD remaining the subject of an FDA investigational new drug evaluation, and is not considered legal as a dietary supplement or food ingredient as of December 2018. Federal illegality has made it difficult historically to conduct research on  CBD. CBD is openly sold in head shops and health food stores in some states where such sales have not been explicitly legalized.  Warning: Because it is  not FDA approved for general use or treatment of pain, it is not required to undergo the same manufacturing controls as prescription drugs.  This means that the available cannabidiol (CBD) may be contaminated with THC.  If this is the case, it will trigger a positive urine drug screen (UDS) test for cannabinoids (Marijuana).  Because a positive UDS for illicit substances is a violation of our medication agreement, your opioid analgesics (pain medicine) may be permanently discontinued. (Last update: 02/22/2018) ____________________________________________________________________________________________   ____________________________________________________________________________________________  Medication Evaluation  Purpose: The purpose of these questions is to establish the onset of effects (speed of absorption), peak benefit (effectiveness), side-effects (ability to tolerate), and duration (excretion/metabolism) of the prescribed medication. The results will help the healthcare provider decide what to do with the medication in question.  Please indicate:  1. Time to onset of benefits: The amount of time it takes for you to begin perceiving any benefits after taking (swallowing) your pain medicine. _______ minutes.  2. Time to peak effect: The time it takes between taking medicine (swallowing it) and the moment when you feel the most benefit (peak effect) from the medicine. _______ minutes.  3. Peak benefit: Please quantify the amount of relief you obtain from the medicine at the moment it is working the best. Does you pain completely go away (100% gone)? Is it three quarters (3/4) better (75% relief)? Does half of your pain go away (50% benefit)? Is it a third better (33% improved)? Or does it go down only by a fourth (25% better)? _______ % relief.  4. Duration of benefit: The time it takes for all benefits of the medicine to be gone. This period of time starts when you first swallow your  pain pill and ends when you feel that your pain has increased to the point where you absolutely need to take another pill. _______ hours and _______ minutes.  5. Adverse reactions: These are side effects and/or adverse reactions you experience since taking the medicine or only when taking your pain medicine. Please circle any and all that apply:  a. Allergic reactions: itching; hives; generalized redness; swelling of the tongue; swelling of the eyes; difficulty breathing. b. Neurological Intolerance: cognitive impairment (difficulty thinking clearly; memory problems; difficulty remembering things; slurred speech); oversedation; sleepiness: unsteadiness; difficulty walking. c. Gastrointestinal problems: constipation; nausea; vomiting; dry heaves; decreased appetite. d. Hormonal problems: decreased sex drive; erection problems; abnormal menstrual period; weight gain or inability to lose weight; weakness or low energy; hair loss.  What to do: When completed, please bring back to your next visit. Please give it to the admitting nurse.  (Last updated: 04/18/2019) ____________________________________________________________________________________________

## 2019-08-25 LAB — COMPLIANCE DRUG ANALYSIS, UR

## 2019-08-26 DIAGNOSIS — M5137 Other intervertebral disc degeneration, lumbosacral region: Secondary | ICD-10-CM | POA: Insufficient documentation

## 2019-08-26 DIAGNOSIS — M51379 Other intervertebral disc degeneration, lumbosacral region without mention of lumbar back pain or lower extremity pain: Secondary | ICD-10-CM | POA: Insufficient documentation

## 2019-08-26 DIAGNOSIS — M1712 Unilateral primary osteoarthritis, left knee: Secondary | ICD-10-CM | POA: Insufficient documentation

## 2019-08-26 DIAGNOSIS — M19011 Primary osteoarthritis, right shoulder: Secondary | ICD-10-CM | POA: Insufficient documentation

## 2019-08-26 DIAGNOSIS — M47816 Spondylosis without myelopathy or radiculopathy, lumbar region: Secondary | ICD-10-CM | POA: Insufficient documentation

## 2019-08-26 DIAGNOSIS — M19012 Primary osteoarthritis, left shoulder: Secondary | ICD-10-CM | POA: Insufficient documentation

## 2019-08-26 DIAGNOSIS — M5030 Other cervical disc degeneration, unspecified cervical region: Secondary | ICD-10-CM | POA: Insufficient documentation

## 2019-08-26 DIAGNOSIS — M5134 Other intervertebral disc degeneration, thoracic region: Secondary | ICD-10-CM | POA: Insufficient documentation

## 2019-08-31 LAB — COMP. METABOLIC PANEL (12)
AST: 16 IU/L (ref 0–40)
Albumin/Globulin Ratio: 2 (ref 1.2–2.2)
Albumin: 4.6 g/dL (ref 3.8–4.9)
Alkaline Phosphatase: 41 IU/L (ref 39–117)
BUN/Creatinine Ratio: 17 (ref 9–20)
BUN: 13 mg/dL (ref 6–24)
Bilirubin Total: <0.2 mg/dL (ref 0.0–1.2)
Calcium: 9.4 mg/dL (ref 8.7–10.2)
Chloride: 101 mmol/L (ref 96–106)
Creatinine, Ser: 0.75 mg/dL — ABNORMAL LOW (ref 0.76–1.27)
GFR calc Af Amer: 122 mL/min/{1.73_m2} (ref >59)
GFR calc non Af Amer: 105 mL/min/{1.73_m2} (ref >59)
Globulin, Total: 2.3 g/dL (ref 1.5–4.5)
Glucose: 363 mg/dL — ABNORMAL HIGH (ref 65–99)
Potassium: 5.1 mmol/L (ref 3.5–5.2)
Sodium: 135 mmol/L (ref 134–144)
Total Protein: 6.9 g/dL (ref 6.0–8.5)

## 2019-08-31 LAB — VITAMIN B12: Vitamin B-12: 573 pg/mL (ref 232–1245)

## 2019-08-31 LAB — 25-HYDROXY VITAMIN D LCMS D2+D3
25-Hydroxy, Vitamin D-2: <1 ng/mL
25-Hydroxy, Vitamin D-3: 30 ng/mL
25-Hydroxy, Vitamin D: 30 ng/mL

## 2019-08-31 LAB — SEDIMENTATION RATE: Sed Rate: 11 mm/hr (ref 0–30)

## 2019-08-31 LAB — MAGNESIUM: Magnesium: 1.9 mg/dL (ref 1.6–2.3)

## 2019-08-31 LAB — C-REACTIVE PROTEIN: CRP: 3 mg/L (ref 0–10)

## 2019-09-07 NOTE — Progress Notes (Deleted)
Patient's Name: Bruce Ferguson  MRN: [id number]  Referring Provider: No ref. provider found  DOB: 1966-03-10  PCP: Patient, No Pcp Per  DOS: 09/09/2019  Note by: Gaspar Cola, MD  Service setting: Virtual Visit (Telephone)  Attending: Gaspar Cola, MD  Location: Telephone Encounter  Specialty: Interventional Pain Management  Patient type: Established   Pain Management Virtual Encounter Note - Virtual Visit via Telephone Telehealth (real-time audio visits between healthcare provider and patient).   Patient's Phone No.:  [phone] (home); There is no such number on file (mobile).; (Preferred) [phone] [email or web address]  [id number] [facility] [id number]: [phone]: [phone]   Pre-screening note:  Our staff contacted Mr. KBrunsmanand offered him an "in person", "face-to-face" appointment versus a telephone encounter. He indicated preferring the telephone encounter, at this time.   Primary Reason(s) for Virtual Visit: Encounter for evaluation before starting new chronic pain management plan of care (Level of risk: moderate) COVID-19*   Social distancing based on CDC ans AMA recommendations.    I contacted Bruce Akamineon 09/09/2019 via telephone.      I clearly identified myself as FGaspar Cola MD. I verified that I was speaking with the correct person using two identifiers (Name: Bruce Ferguson and date of birth: [birth year].  Advanced Informed Consent I sought verbal advanced consent from Bruce Joefor virtual visit interactions. I informed Mr. KGortneyof possible security and privacy concerns, risks, and limitations associated with providing "not-in-person" medical evaluation and management services. I also informed Mr. KChoquetteof the availability of "in-person" appointments. Finally, I informed him that there would be a charge for the virtual visit and that he could be  personally, fully or partially,  financially responsible for it. Mr. KGalantexpressed understanding and agreed to proceed.   Historic Elements   Mr. Bruce Tippyis a 53y.o. year old, male patient evaluated today after his last encounter by our practice on 08/21/2019. Bruce Ferguson has a past medical history of Anxiety, CRPS (complex regional pain syndrome type I), Irritable bowel, and Trigeminal neuralgia. He also  has a past surgical history that includes Replacement total knee (Right); Intrathecal pump implant; and Knee surgery (Left). Mr. KEasomhas a current medication list which includes the following prescription(s): bupropion, carbamazepine, carisoprodol, diazepam, iron slow release, naloxone, oxycodone-acetaminophen, PAIN MANAGEMENT INTRATHECAL, IT, PUMP, sitagliptin, trazodone, and zolpidem. He  reports that he has quit smoking. He has never used smokeless tobacco. He reports that he does not drink alcohol or use drugs. Mr. Bruce Ferguson allergic to amitriptyline; penicillins; and strawberry extract.   HPI  He is being evaluated for review of studies ordered on initial visit and to consider treatment plan options. Today I went over the results of his tests. These were explained in "Layman's terms". During today's appointment I went over my diagnostic impression, as well as the proposed treatment plan.  The patient is being [facility] for management of an intrathecal pump.  According to the patient the primary area of pain is that of the lower back, bilaterally, with the right side being worse than the left.  He denies any surgeries, except for the intrathecal pump placement around Apr 22, 2019.  The patient denies any physical therapy but does admit to a more recent MRI of the lumbar area.  The patient secondary area pain is that of the shoulders, bilaterally, with the left being worse  than the right.  Again he denies any type of surgery of the shoulders but he does admit to having had physical therapy for the shoulders  many years ago.  He denies any recent x-rays or any other type of imaging.  The third area pain is described to be that of the lower extremities, bilaterally, with the left being worse than the right.  In the case of the left lower extremity the pain goes all the way down to the ankle running through the back of the leg with pain also in the groin area.  In the case of the right lower extremity the pain goes all the way down into the toes following what seems to be an L4/L5 dermatomal distribution.  Included within this, the patient has bilateral knee pain with the left having had 7 different arthroscopies and on the right side having had a total knee replacement around 2020.  He does admit having had bilateral knee MRIs with the right one having been approximately 3 years ago and the left one in 2010.  He also does admit to having had some x-rays of the area, but none of this seems to be available for this evaluation.  This knee pain is described to be the patient's fourth area of pain.  The patient's fifth area of pain is that of the wrists, hands, and forearms, bilaterally with the right being worse than the left.  He again denies any type of surgeries in the area but does admit having had some x-rays on the right side after a fall.  He denies any recent physical therapy for that area and pain.  The sixth area pain is described to be that of the necks and shoulders where the pain is referred from the neck to the shoulders and shoulder blades through the back of the neck, bilaterally, with the left being worse than the right.  This pain seems to radiate towards the shoulder blades and the biceps area following what seems to be a C5 dermatomal distribution.  In terms of the pain in the biceps area is seems to be worse on the left side when compared to the right.  Once again, he denies any surgeries of the cervical region, physical therapy, but does admit having had some x-rays, some time ago.  The patient  describes his seventh area of pain is been that of the irritable bowel syndrome, which he believes to be associated with his fibromyalgia.  The eighth area pain is that of a trigeminal neuralgia on the left side which he has had problems with for the past 3 years.  See also has a tentative diagnosis of CRPS type I, but he indicates that this is "generalized".  He has been told that he has this CRPS that is affecting his upper and lower extremities, which is extremely rare.  I asked the patient whether or not he has had any nerve conduction test and he indicated that he had some many years ago when he refuses to have any more due to how bad it was to him.  He refers that he reacts really bad to any kind of electrical stimulation such as the spinal cord stimulator or the TENS unit.  In both instances he refers that it made the pain worse.  Today I went over the results of the test that we had ordered including the imaging.  The patient was informed that his intrathecal catheter appears to be at the level C6, on the left  posterior epidural canal.        Controlled Substance Pharmacotherapy Assessment REMS (Risk Evaluation and Mitigation Strategy)  Analgesic: Oxycodone/APAP 10/325, 1 tab PO q 6 hrs (40 mg/day of oxycodone) MME/day: 60 mg/day.   Monitoring: [place] PMP: PDMP reviewed during this encounter.       Not applicable at this point since we have not taken over the patient's medication management yet. List of other Serum/Urine Drug Screening Test(s):  No results found for: AMPHSCRSER, BARBSCRSER, BENZOSCRSER, [place], COCAINSCRNUR, [place], [place], [place], [place]QU[place], [place], [place], [place], [place] List of all UDS test(s) done:  Lab Results  Component Value Date   SUMMARY Note 08/21/2019   Last UDS on record: Summary  Date Value Ref Range Status  08/21/2019 Note  Final    Comment:    ==================================================================== Compliance  Drug Analysis, Ur ==================================================================== Test                             Result       Flag       Units Drug Present and Declared for Prescription Verification   Desmethyldiazepam              427          EXPECTED   ng/mg creat   Oxazepam                       487          EXPECTED   ng/mg creat   Temazepam                      1068         EXPECTED   ng/mg creat    Desmethyldiazepam, oxazepam, and temazepam are expected metabolites    of diazepam. Desmethyldiazepam and oxazepam are also expected    metabolites of other drugs, including chlordiazepoxide, prazepam,    clorazepate, and halazepam. Oxazepam is an expected metabolite of    temazepam. Oxazepam and temazepam are also available as scheduled    prescription medications.   Oxycodone                      3833         EXPECTED   ng/mg creat   Oxymorphone                    2705         EXPECTED   ng/mg creat   Noroxycodone                   >6135        EXPECTED   ng/mg creat   Noroxymorphone                 958          EXPECTED   ng/mg creat    Sources of oxycodone are scheduled prescription medications.    Oxymorphone, noroxycodone, and noroxymorphone are expected    metabolites of oxycodone. Oxymorphone is also available as a    scheduled prescription medication.   Carisoprodol                   PRESENT      EXPECTED   Meprobamate                    PRESENT      EXPECTED  Source of carisoprodol is a scheduled prescription medication.    Meprobamate is an expected metabolite of carisoprodol.   Zolpidem                       PRESENT      EXPECTED   Zolpidem Acid                  PRESENT      EXPECTED    Zolpidem acid is an expected metabolite of zolpidem.   Trazodone                      PRESENT      EXPECTED   1,3 chlorophenyl piperazine    PRESENT      EXPECTED    1,3-chlorophenyl piperazine is an expected metabolite of trazodone.   Acetaminophen                  PRESENT       EXPECTED Drug Present not Declared for Prescription Verification   Alpha-hydroxyalprazolam        24           UNEXPECTED ng/mg creat    Alpha-hydroxyalprazolam is an expected metabolite of alprazolam.    Source of alprazolam is a scheduled prescription medication.   Morphine                   436          EXPECTED ng/mg creat (INTRATHECAL PUMP)    Potential sources of morphine include administration of codeine or    morphine, use of heroin, or ingestion of poppy seeds.   Gabapentin                     PRESENT      UNEXPECTED   Ibuprofen                      PRESENT      UNEXPECTED Drug Absent but Declared for Prescription Verification   Carbamazepine                  Not Detected UNEXPECTED   Bupropion                      Not Detected UNEXPECTED ==================================================================== Test                      Result    Flag   Units      Ref Range   Creatinine              163              mg/dL      >=20 ==================================================================== Declared Medications:  The flagging and interpretation on this report are based on the  following declared medications.  Unexpected results may arise from  inaccuracies in the declared medications.  **Note: The testing scope of this panel includes these medications:  Bupropion  Carbamazepine  Carisoprodol  Diazepam  Oxycodone (Percocet)  Trazodone  **Note: The testing scope of this panel does not include small to  moderate amounts of these reported medications:  Acetaminophen (Percocet)  Zolpidem  **Note: The testing scope of this panel does not include the  following reported medications:  Iron  Naloxone  Sitagliptin (Januvia) ==================================================================== For clinical consultation, please call [phone]. ====================================================================    UDS interpretation: No unexpected findings.  Medication Assessment Form: Patient introduced to form today Treatment compliance: Treatment may start today if patient agrees with proposed plan. Evaluation of compliance is not applicable at this point Risk Assessment Profile: Aberrant behavior: See initial evaluations. None observed or detected today Comorbid factors increasing risk of overdose: See initial evaluation. No additional risks detected today Opioid risk tool (ORT):  Opioid Risk  08/21/2019  Alcohol 0  Illegal Drugs 0  Rx Drugs 0  Alcohol 0  Illegal Drugs 0  Rx Drugs 0  Age between 16-45 years  0  History of Preadolescent Sexual Abuse 0  Psychological Disease 0  Depression 0  Opioid Risk Tool Scoring 0  Opioid Risk Interpretation Low Risk    ORT Scoring interpretation table:  Score <3 = Low Risk for SUD  Score between 4-7 = Moderate Risk for SUD  Score >8 = High Risk for Opioid Abuse   Risk of substance use disorder (SUD): Low  Risk Mitigation Strategies:  Patient opioid safety counseling: Completed today. Counseling provided to patient as per "Patient Counseling Document". Document signed by patient, attesting to counseling and understanding Patient-Prescriber Agreement (PPA): Obtained today.  Controlled substance notification to other providers: Written and sent today.  Pharmacologic Plan: Today we may be taking over the patient's pharmacological regimen. See below.             Meds   Current Outpatient Medications:    buPROPion (WELLBUTRIN XL) 300 MG 24 hr tablet, Take 300 mg by mouth daily., Disp: , Rfl:    carbamazepine (TEGRETOL XR) 200 MG 12 hr tablet, Take 200 mg by mouth daily., Disp: , Rfl:    carisoprodol (SOMA) 350 MG tablet, Take 350 mg by mouth 4 (four) times daily., Disp: , Rfl:    diazepam (VALIUM) 5 MG tablet, Take 5 mg by mouth daily as needed for anxiety., Disp: , Rfl:    Ferrous Sulfate (IRON SLOW RELEASE) 142 (45 Fe) MG TBCR, Take by mouth daily., Disp: , Rfl:    naloxone (NARCAN)  nasal spray 4 mg/0.1 mL, Place 1 spray into the nose as needed., Disp: , Rfl:    oxyCODONE-acetaminophen (PERCOCET) 10-325 MG tablet, Take 1 tablet by mouth every 6 (six) hours as needed for pain., Disp: , Rfl:    PAIN MANAGEMENT INTRATHECAL, IT, PUMP, 1 each by Intrathecal route. Intrathecal (IT) medication:  Morphine, Disp: , Rfl:    sitaGLIPtin (JANUVIA) 100 MG tablet, Take 100 mg by mouth daily., Disp: , Rfl:    traZODone (DESYREL) 150 MG tablet, Take by mouth at bedtime., Disp: , Rfl:    zolpidem (AMBIEN) 10 MG tablet, Take 10 mg by mouth at bedtime as needed for sleep., Disp: , Rfl:   Laboratory Chemistry Profile   Screening No results found.  Inflammation (CRP: Acute Phase) (ESR: Chronic Phase) Lab Results  Component Value Date   CRP 3 08/21/2019   ESRSEDRATE 11 08/21/2019                         Rheumatology No results found.  Renal Lab Results  Component Value Date   BUN 13 08/21/2019   CREATININE 0.75 (L) 08/21/2019   BCR 17 08/21/2019   GFRAA 122 08/21/2019   GFRNONAA 105 08/21/2019                             Hepatic Lab Results  Component Value Date   AST 16 08/21/2019  ALBUMIN 4.6 08/21/2019   ALKPHOS 41 08/21/2019                        Electrolytes Lab Results  Component Value Date   NA 135 08/21/2019   K 5.1 08/21/2019   CL 101 08/21/2019   CALCIUM 9.4 08/21/2019   MG 1.9 08/21/2019                        Neuropathy Lab Results  Component Value Date   EXHBZJIR67 893 08/21/2019                        CNS No results found.  Bone Lab Results  Component Value Date   25OHVITD1 30 08/21/2019   25OHVITD2 <1.0 08/21/2019   25OHVITD3 30 08/21/2019                         Coagulation No results found.  Cardiovascular No results found.  ID No results found.  Cancer No results found.  Endocrine No results found.  Note: Lab results reviewed.  Recent Diagnostic Imaging Review  Cervical Imaging: Cervical DG Bending/F/E  views:  Results for orders placed during the hospital encounter of 08/21/19  DG Cervical Spine With Flex & Extend   Narrative CLINICAL DATA:  Cervicalgia  EXAM: CERVICAL SPINE COMPLETE WITH FLEXION AND EXTENSION VIEWS  COMPARISON:  None.  FINDINGS: Epidural pain catheter tip terminates at the level of the C6 superior endplate.  Preservation of the normal cervical lordosis without traumatic listhesis. No abnormal facet widening. Normal alignment of the craniocervical and atlantoaxial articulations. Mild multilevel intervertebral disc height loss with spondylitic endplate changes, most pronounced at C5-6 with large anterior osteophyte formation. Flexion and extension views reveal no dynamic instability.  Mineralization in the nuchal ligament is benign incidental finding. No prevertebral swelling or paravertebral soft tissue abnormality.  IMPRESSION: Mild multilevel degenerative disc disease, most pronounced at C5-6.   Electronically Signed   By: Lovena Le M.D.   On: 08/22/2019 03:10    Shoulder Imaging: Shoulder-R DG:  Results for orders placed during the hospital encounter of 08/21/19  DG Shoulder Right   Narrative CLINICAL DATA:  Right shoulder pain, limited range of motion  EXAM: RIGHT SHOULDER - 2+ VIEW  COMPARISON:  None.  FINDINGS: No acute osseous abnormality or traumatic malalignment. Mild glenohumeral and acromioclavicular arthrosis. Slightly high positioning of the distal right clavicle relative to the acromion is symmetric to the other side and is likely normal for this patient. Soft tissues are unremarkable. Included portion of the right chest wall is free of acute abnormality.  IMPRESSION: Mild glenohumeral and acromioclavicular arthrosis.  No acute osseous abnormality.   Electronically Signed   By: Lovena Le M.D.   On: 08/22/2019 03:12    Shoulder-L DG:  Results for orders placed during the hospital encounter of 08/21/19  DG Shoulder  Left   Narrative CLINICAL DATA:  Left shoulder pain, limited range of motion  EXAM: LEFT SHOULDER - 2+ VIEW  COMPARISON:  None.  FINDINGS: There is no evidence of fracture or dislocation. There is mild glenohumeral and acromioclavicular arthrosis. Slightly high position of the distal clavicle relative to the acromion is symmetric to the contralateral side and is likely normal for this patient. Included portion of the right chest wall is unremarkable. Remaining soft tissues are free of abnormality.  IMPRESSION: Mild glenohumeral and acromioclavicular  arthrosis. No acute osseous abnormality.   Electronically Signed   By: Lovena Le M.D.   On: 08/22/2019 03:13    Thoracic Imaging: Thoracic DG 2-3 views:  Results for orders placed during the hospital encounter of 08/21/19  DG Thoracic Spine 2 View   Narrative CLINICAL DATA:  Generalized pain, arthralgia  EXAM: THORACIC SPINE 2 VIEWS  COMPARISON:  None.  FINDINGS: Poor visualization of the upper thoracic spine on lateral radiography. Patient unable to perform swimmer's view. Patient's epidural pain catheter spans the thoracic spine. Multilevel discogenic changes are present throughout the thoracic spine. No acute fracture or vertebral body height loss is evident. No suspicious osseous lesions. Included portions of the lungs and cardiomediastinal contours are unremarkable for technique.  IMPRESSION: Multilevel discogenic changes throughout the thoracic spine. No acute osseous abnormality.   Electronically Signed   By: Lovena Le M.D.   On: 08/22/2019 03:04    Lumbosacral Imaging: Lumbar DG Bending views:  Results for orders placed during the hospital encounter of 08/21/19  DG Lumbar Spine Complete W/Bend   Narrative CLINICAL DATA:  Low back pain  EXAM: LUMBAR SPINE - COMPLETE WITH BENDING VIEWS  COMPARISON:  None.  FINDINGS: Portions of spine is obscured on the oblique projection by the patient's  epidural pain pump reservoir.  Transitional lumbosacral anatomy with 6 lumbar type vertebral bodies including a partially sacralized L6 vertebra. Mild anterior wedging of the L1 level could reflect remote compression deformity or developmental anterior wedging at the thoracolumbar junction.  Minimal intervertebral disc height loss is most pronounced at the lower lumbar levels and lower thoracic spine with early endplate changes. Facet degenerative changes are most severe at L4-L6.  No visible spondylolysis or spondylolisthesis. No dynamic instability is seen on flexion or extension views. No suspicious osseous lesions. Soft tissues are unremarkable.  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  No acute osseous abnormality.  No dynamic instability on flexion or extension views   Electronically Signed   By: Lovena Le M.D.   On: 08/22/2019 02:46          Knee Imaging: Knee-R DG 1-2 views:  Results for orders placed during the hospital encounter of 08/21/19  DG Knee 1-2 Views Right   Narrative CLINICAL DATA:  Generalized pain, arthralgia  EXAM: RIGHT KNEE - 1-2 VIEW  COMPARISON:  None.  FINDINGS: Prior total right knee arthroplasty with posterior patellar resurfacing. Hardware is intact and well engaged in expected alignment. No periprosthetic fracture or perihardware lucency. No sizeable joint effusion. No soft tissue swelling.  IMPRESSION: Prior total right knee arthroplasty without evidence of hardware complication.  No acute osseous abnormality.   Electronically Signed   By: Lovena Le M.D.   On: 08/22/2019 02:47    Knee-L DG 1-2 views:  Results for orders placed during the hospital encounter of 08/21/19  DG Knee 1-2 Views Left   Narrative CLINICAL DATA:  Generalized pain, arthralgia  EXAM: LEFT KNEE - 1-2 VIEW  COMPARISON:  None.  FINDINGS: Mild tricompartmental degenerative changes with apparent  narrowing of the medial compartment incompletely assessed on nonweightbearing views. No acute fracture or traumatic malalignment. No sizable effusion or significant swelling. Vascular calcium is noted in the soft tissues which are otherwise unremarkable.  IMPRESSION: Mild tricompartmental degenerative changes, with medial compartmental narrowing.  No acute osseous abnormality.  Atherosclerosis.   Electronically Signed   By: Lovena Le M.D.   On: 08/22/2019 02:47    Complexity Note: Imaging results  reviewed. Results shared with Mr. Rivadeneira, using Layman's terms.                         Assessment  The primary encounter diagnosis was Chronic pain syndrome. Diagnoses of Chronic low back pain (Primary Area of Pain) (Bilateral) (R>L) w/ sciatica (Bilateral), Chronic shoulder pain (Secondary area of Pain) (Bilateral) (L>R), Chronic lower extremity pain (Third area of Pain) (Bilateral) (L>R), Chronic knee pain (Fourth area of Pain) (Bilateral), Chronic upper extremity pain (Fifth area of Pain) (Bilateral) (R>L), Chronic neck pain (Sixth area of Pain) (Bilateral) (L>R), and Presence of implanted infusion pump (Fluonix) were also pertinent to this visit.  Plan of Care  I am having Tally Ferguson maintain his zolpidem, buPROPion, diazepam, sitaGLIPtin, naloxone, oxyCODONE-acetaminophen, Iron Slow Release, carisoprodol, carbamazepine, traZODone, and (PAIN MANAGEMENT INTRATHECAL, IT, PUMP). Pharmacotherapy (Medications Ordered): No orders of the defined types were placed in this encounter.  Procedure Orders    No procedure(s) ordered today   Lab Orders  No laboratory test(s) ordered today   Imaging Orders  No imaging studies ordered today   Referral Orders  No referral(s) requested today    Orders:  No orders of the defined types were placed in this encounter.  Pharmacological management options:  Opioid Analgesics: We'll take over management today. See above orders Membrane  stabilizer: Options discussed, including a trial. Muscle relaxant: We have discussed the possibility of a trial NSAID: Trial discussed. Other analgesic(s): To be determined at a later time    Interventional management options: Planned, scheduled, and/or pending:    Intrathecal pump refill.   Considering:   Palliative management of intrathecal pump   PRN Procedures:   Palliative management of intrathecal pump with refills and adjustments, as needed.    Total duration of non-face-to-face encounter: *** minutes.  Follow-up plan:   No follow-ups on file.    Recent Visits Date Type Provider Dept  08/21/19 Office Visit Milinda Pointer, MD [facility]  Showing recent visits within past 90 days and meeting all other requirements   Today's Visits Date Type Provider Dept  09/09/19 Appointment Milinda Pointer, MD [facility]  Showing today's visits and meeting all other requirements   Future Appointments Date Type Provider Dept  10/08/19 Appointment Milinda Pointer, MD [facility]  Showing future appointments within next 90 days and meeting all other requirements   Primary Care Physician: Patient, No Pcp Per Location: Telephone Virtual Visit Note by: Gaspar Cola, MD Date: 09/09/2019; Time: 6:16 AM  Note: This dictation was prepared with Dragon dictation. Any transcriptional errors that may result from this process are unintentional.  Disclaimer:  * Given the special circumstances of the COVID-19 pandemic, the federal government has announced that the Office for Civil Rights (OCR) will exercise its enforcement discretion and will not impose penalties on physicians using telehealth in the event of noncompliance with regulatory requirements under the [place] (HIPAA) in connection with the good faith provision of telehealth during the MQKMM-38 national public health emergency. ([place])

## 2019-09-09 ENCOUNTER — Other Ambulatory Visit: Payer: Self-pay

## 2019-09-09 ENCOUNTER — Ambulatory Visit: Payer: Medicare Other | Admitting: Pain Medicine

## 2019-09-18 ENCOUNTER — Other Ambulatory Visit: Payer: Self-pay

## 2019-09-18 MED ORDER — PAIN MANAGEMENT IT PUMP REFILL: 1.0000 | Freq: Once | INTRATHECAL | 0 refills | Status: AC

## 2019-10-02 NOTE — Progress Notes (Signed)
Test: Blood sugar (Glucose levels) Finding(s): High (hyperglycemia) Normal Level(s): Normal fasting (NPO x 8 hours) glucose levels are between 65-99 mg/dl, with 2 hour fasting, levels are usually less than 140 mg/dl. Clinical significance: Any random blood glucose level greater than 200 mg/dl is considered to be Diabetes. Signs and symptoms may include: (when persistently above 180 mg/dL) Increased thirst; headaches; trouble concentrating; blurred vision; frequent peeing (urination); fatigue (weakness and tired feeling); weight loss. The most common and classical symptoms of an undiagnosed diabetes with hyperglycemia are: Increased urinary frequency (polyuria), thirst (polydipsia), hunger (polyphagia), and unexplained weight loss; numbness in the extremities, pain in feet (dysesthesias), fatigue, and blurred vision; recurrent or severe infections; loss of consciousness or severe nausea/vomiting (ketoacidosis) or coma. Patient Recommendation(s): Fasting levels above 140 mg/dL or any levels above 200 mg/dL should follow-up with PCP (Primary Care Physician) for further evaluation. ___________________________________________________________________________________ - Normal Creatinine levels are between 0.5 and 0.9 mg/dl for our lab. Low blood levels of creatinine are not common, but they are also not usually a cause for concern. They can be seen with conditions that result in decreased muscle mass. - Possible causes: Low blood creatinine levels can mean lower muscle mass caused by a disease, such as muscular dystrophy, or by aging. Low levels can also mean some types of severe liver disease or a diet very low in protein. Pregnancy can also cause low blood creatinine levels. ___________________________________________________________________________________

## 2019-10-08 ENCOUNTER — Ambulatory Visit (HOSPITAL_BASED_OUTPATIENT_CLINIC_OR_DEPARTMENT_OTHER): Payer: Medicare Other | Admitting: Pain Medicine

## 2019-10-08 DIAGNOSIS — Z451 Encounter for adjustment and management of infusion pump: Secondary | ICD-10-CM | POA: Insufficient documentation

## 2019-10-08 DIAGNOSIS — G894 Chronic pain syndrome: Secondary | ICD-10-CM

## 2019-10-08 NOTE — Progress Notes (Deleted)
No show

## 2019-10-16 MED FILL — Medication: INTRATHECAL | Qty: 1 | Status: AC

## 2021-02-26 IMAGING — CR DG CERVICAL SPINE WITH FLEX & EXTEND
1 series · 7 of 7 positions shown · non-contrast
Comparison: None.

CLINICAL DATA: Cervicalgia

EXAM:
CERVICAL SPINE COMPLETE WITH FLEXION AND EXTENSION VIEWS

[Series 1: dg cervical spine with flex & extend · 0.14mm/px · 7 of 7 slices shown]
[im 1/7]
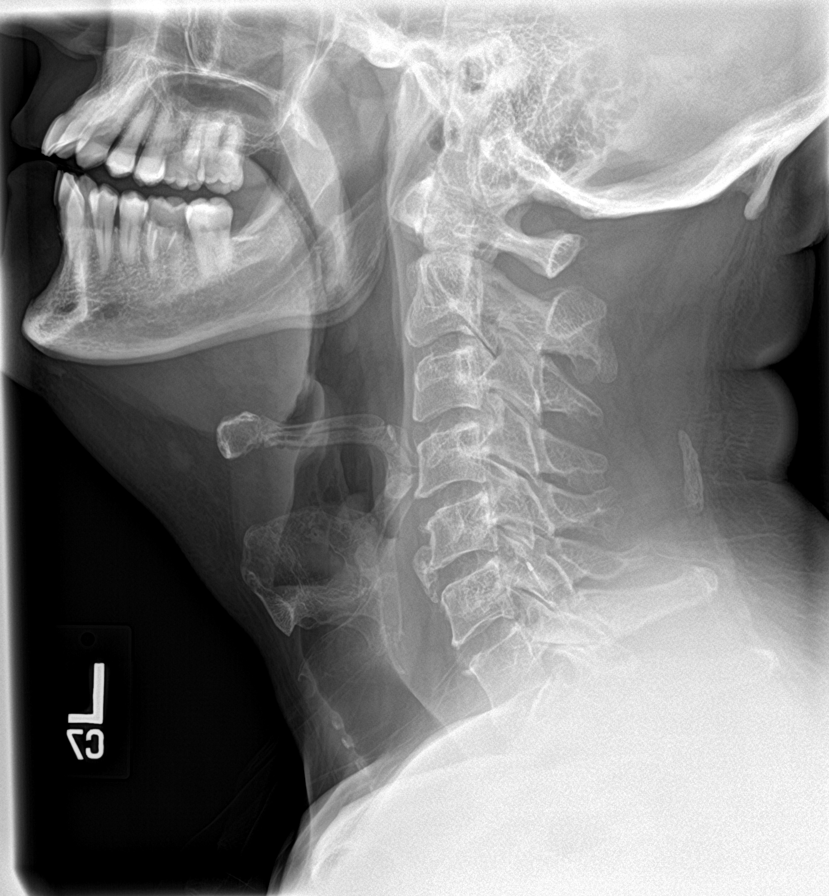
[im 2/7]
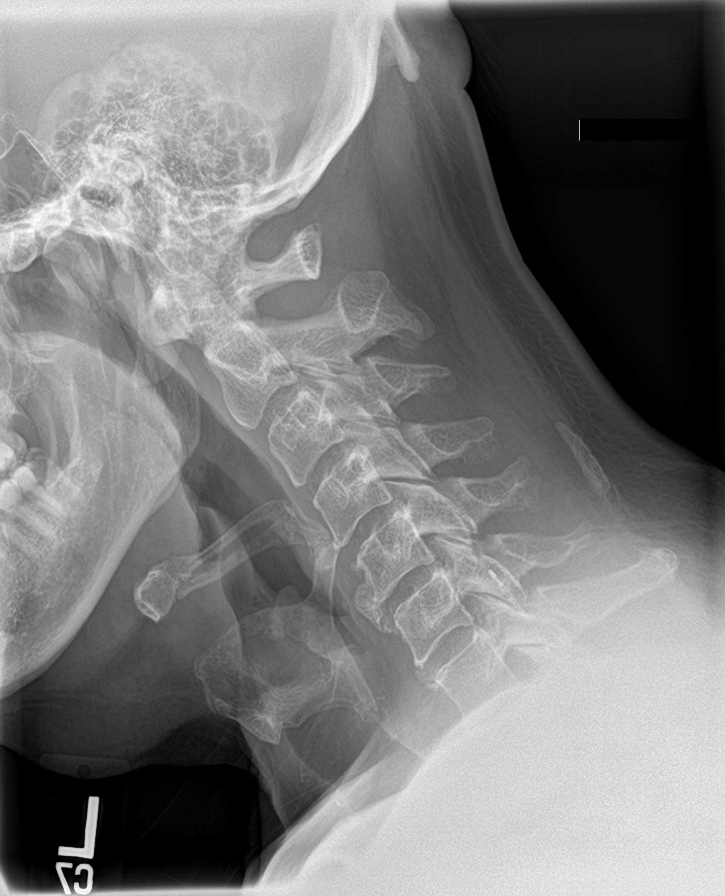
[im 3/7]
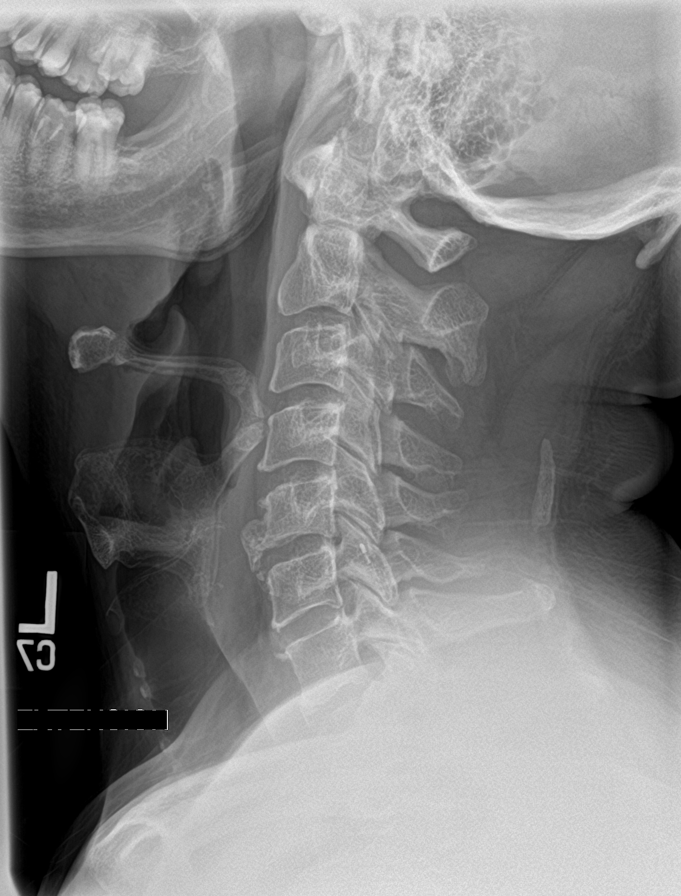
[im 4/7]
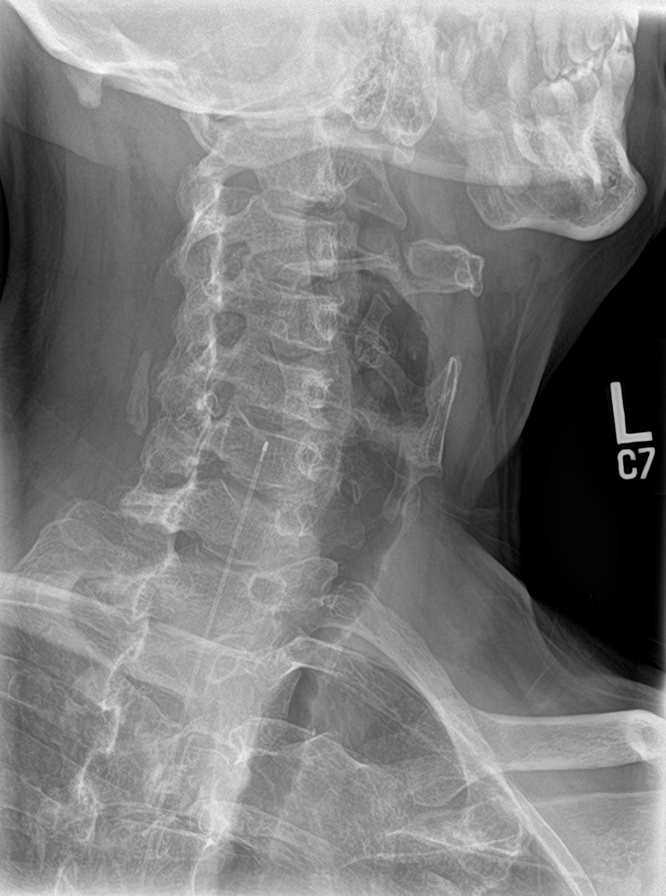
[im 5/7]
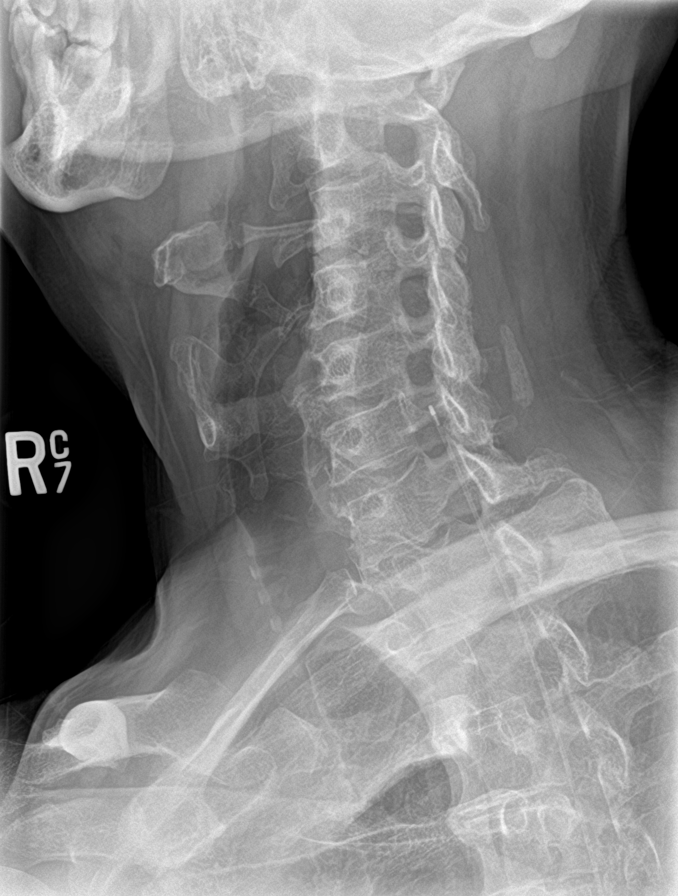
[im 6/7]
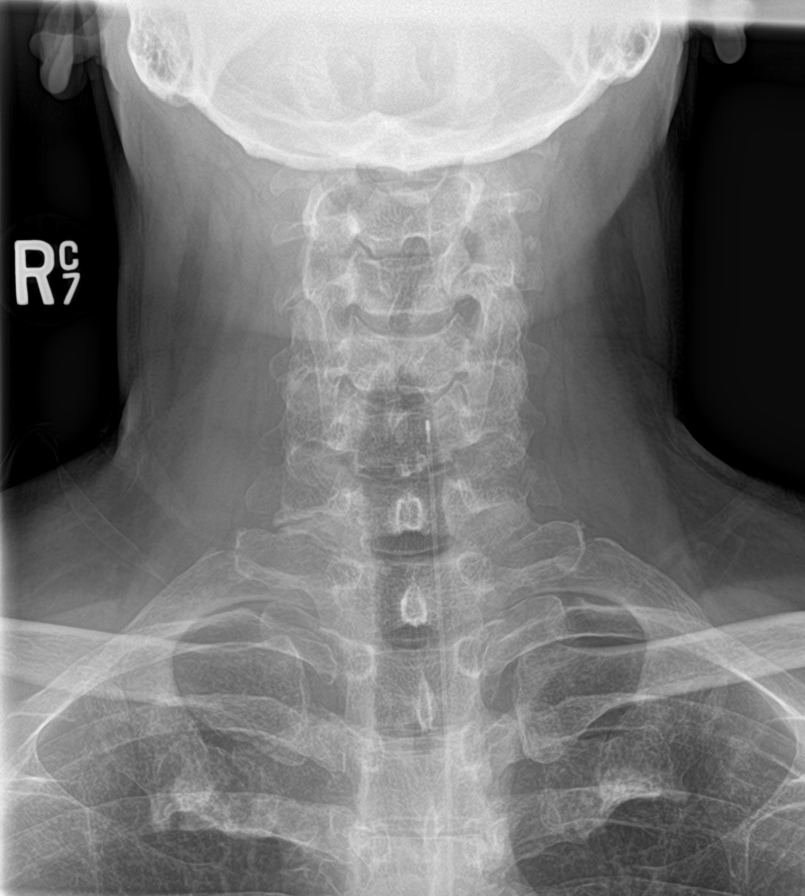
[im 7/7]
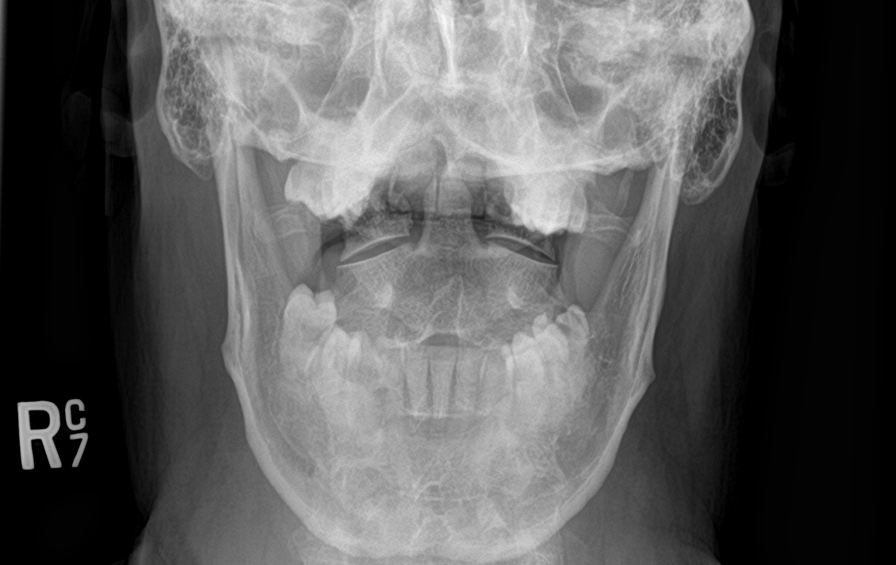

[7 of 7 positions shown; findings below may reference images not displayed]

FINDINGS: Epidural pain catheter tip terminates at the level of the C6
superior endplate.

Preservation of the normal cervical lordosis without traumatic
listhesis. No abnormal facet widening. Normal alignment of the
craniocervical and atlantoaxial articulations. Mild multilevel
intervertebral disc height loss with spondylitic endplate changes,
most pronounced at C5-6 with large anterior osteophyte formation.
Flexion and extension views reveal no dynamic instability.

Mineralization in the nuchal ligament is benign incidental finding.
No prevertebral swelling or paravertebral soft tissue abnormality.
IMPRESSION: Mild multilevel degenerative disc disease, most pronounced at C5-6.

## 2021-02-26 IMAGING — CR DG SHOULDER 2+V*R*
1 series · 3 of 3 positions shown · non-contrast
Comparison: None.

CLINICAL DATA: Right shoulder pain, limited range of motion

EXAM:
RIGHT SHOULDER - 2+ VIEW

[Series 1: dg shoulder right · 0.14mm/px · 3 of 3 slices shown]
[im 1/3]
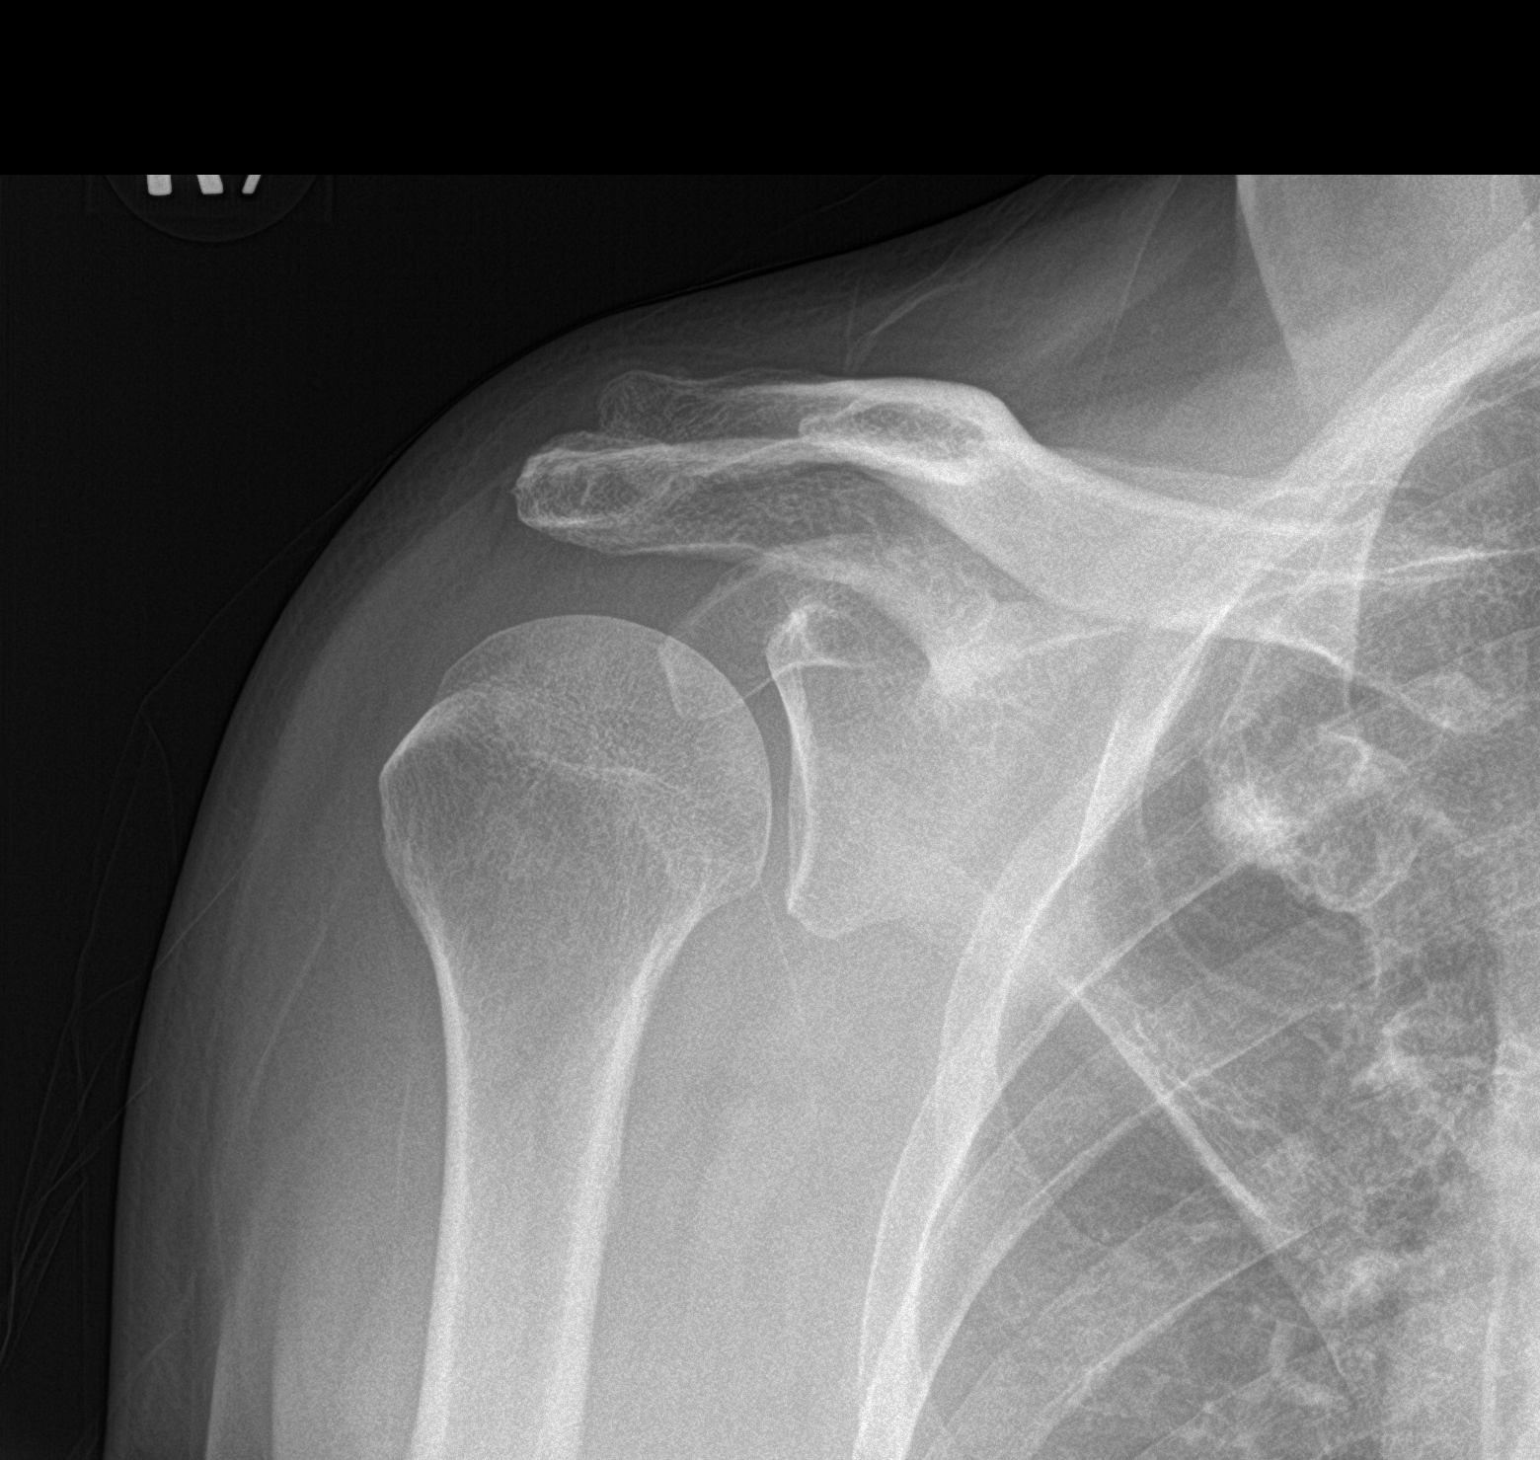
[im 2/3]
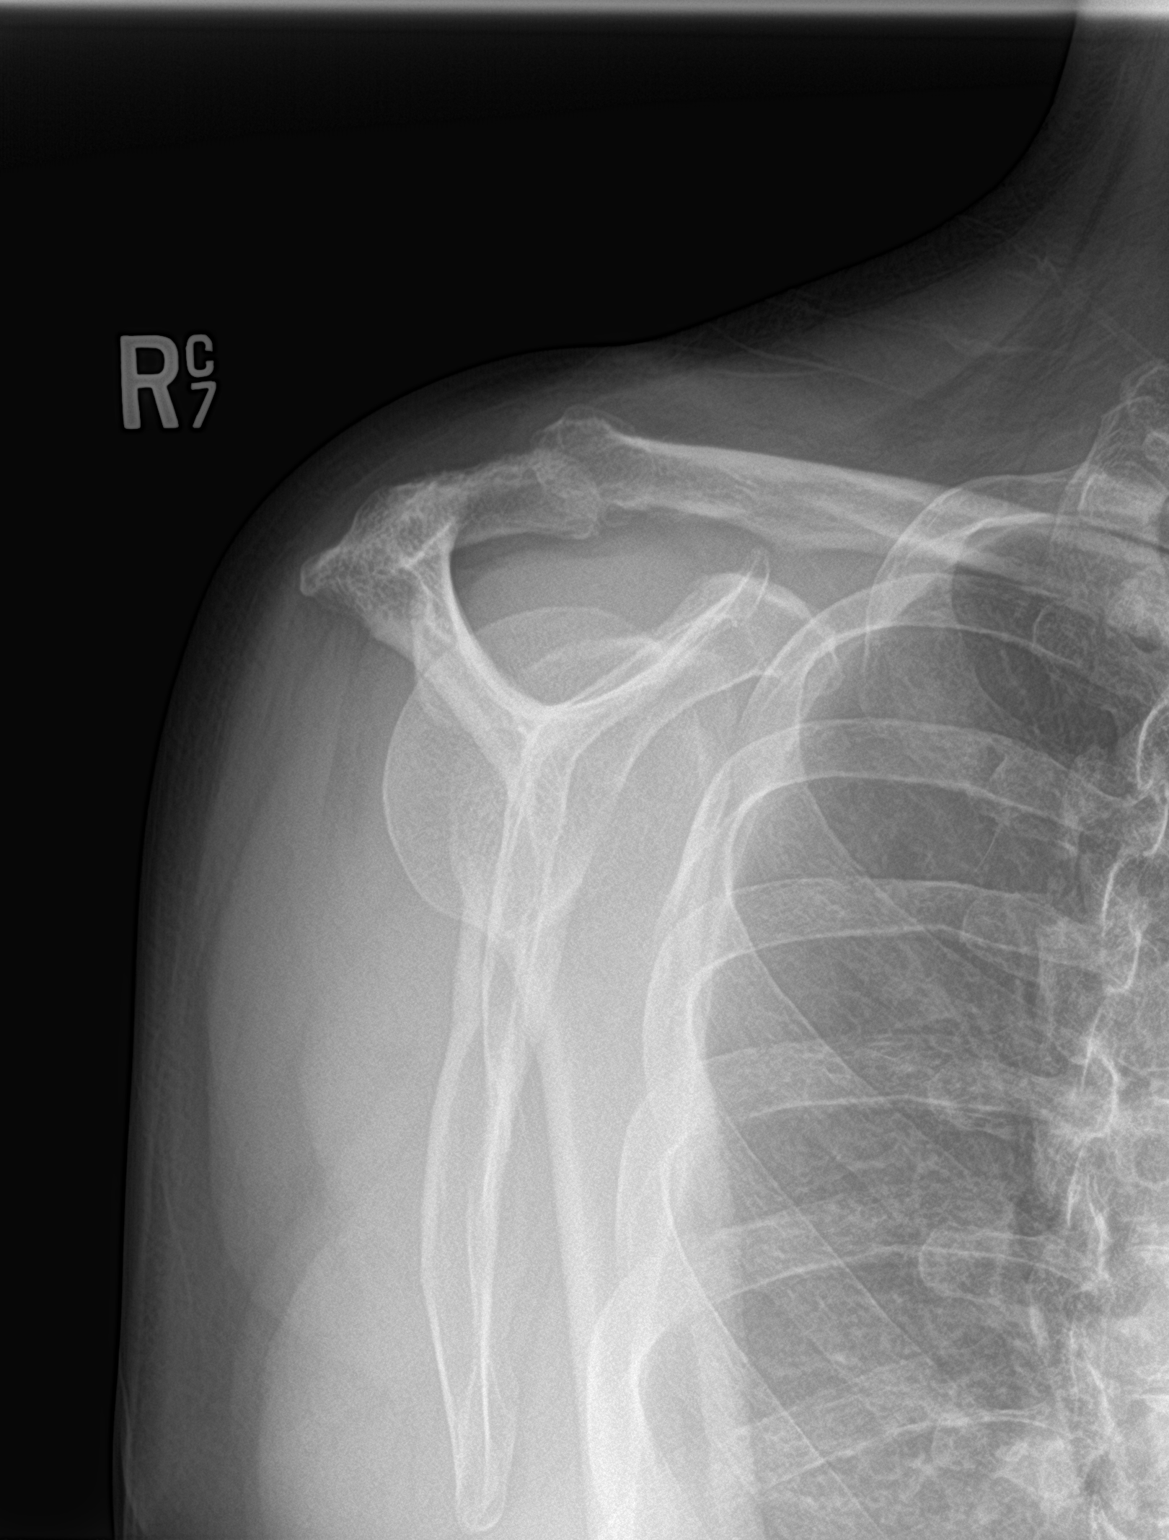
[im 3/3]
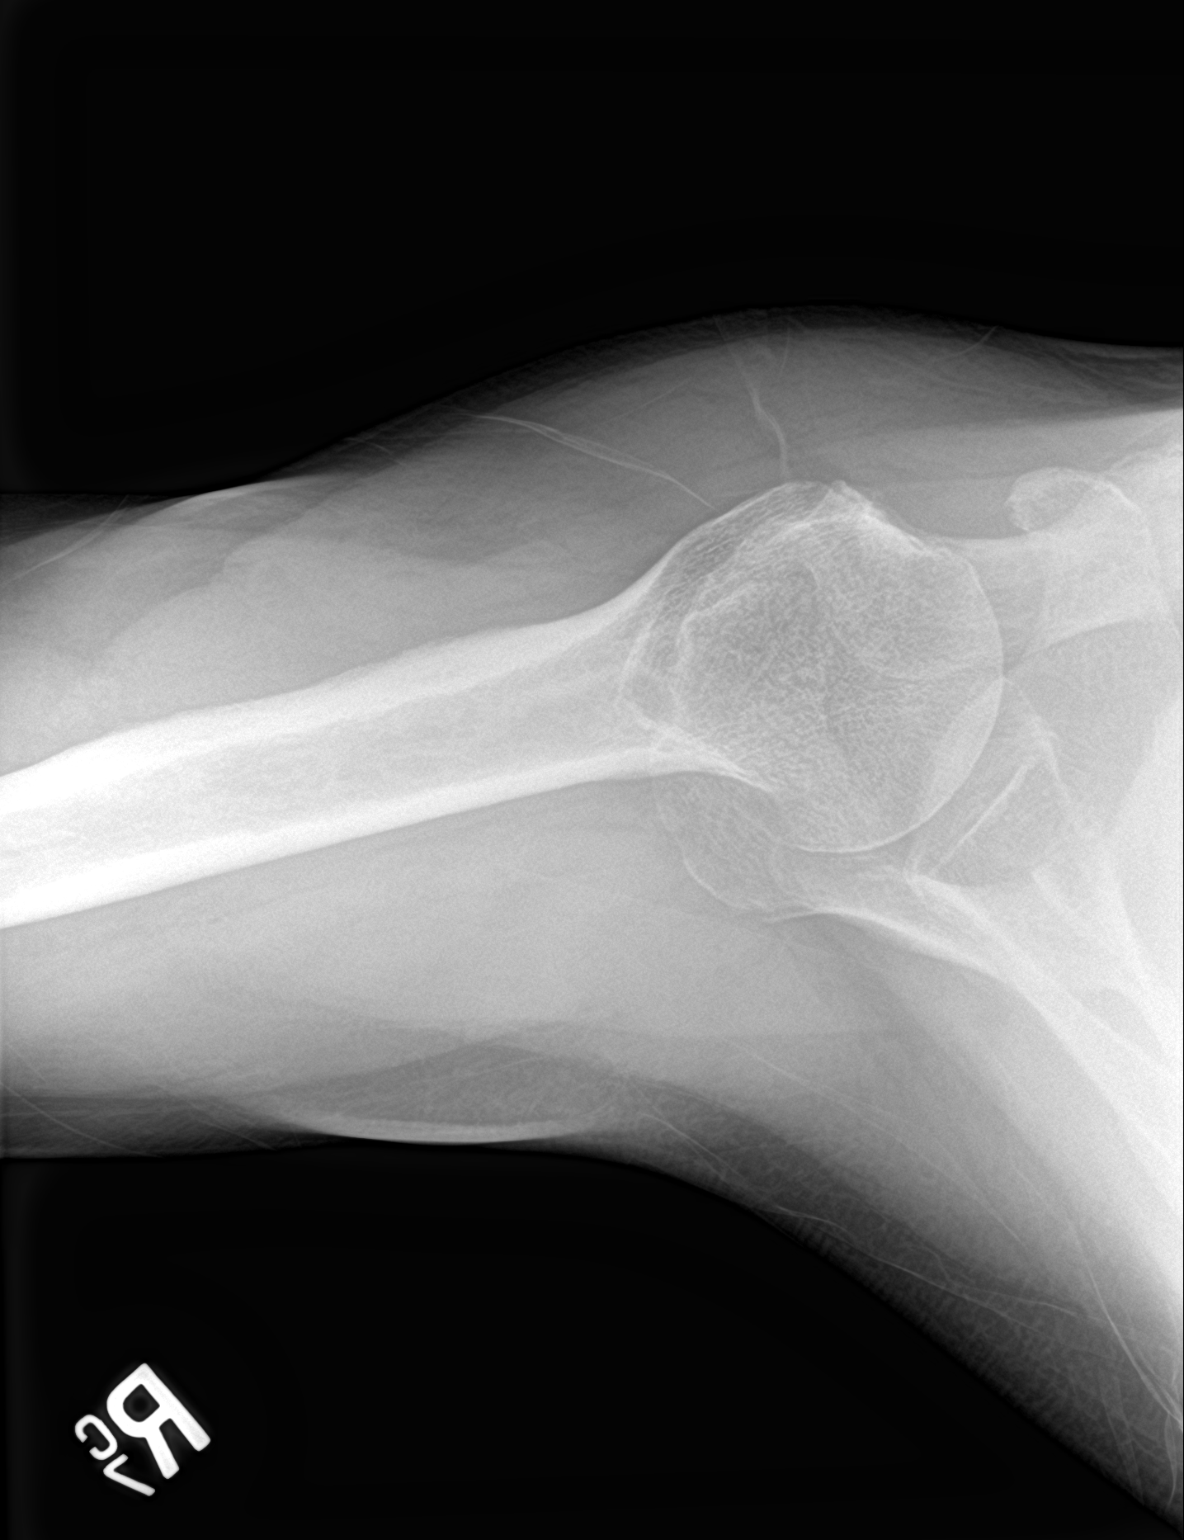

[3 of 3 positions shown; findings below may reference images not displayed]

FINDINGS: No acute osseous abnormality or traumatic malalignment. Mild
glenohumeral and acromioclavicular arthrosis. Slightly high
positioning of the distal right clavicle relative to the acromion is
symmetric to the other side and is likely normal for this patient.
Soft tissues are unremarkable. Included portion of the right chest
wall is free of acute abnormality.
IMPRESSION: Mild glenohumeral and acromioclavicular arthrosis.

No acute osseous abnormality.
